# Patient Record
Sex: Female | Born: 1935 | Race: White | Hispanic: No
Health system: Southern US, Community
[De-identification: ages and names within clinical notes are randomized; demographics above are authoritative.]

## PROBLEM LIST (undated history)

## (undated) DIAGNOSIS — N289 Disorder of kidney and ureter, unspecified: Secondary | ICD-10-CM

## (undated) DIAGNOSIS — I10 Essential (primary) hypertension: Secondary | ICD-10-CM

## (undated) DIAGNOSIS — F0390 Unspecified dementia without behavioral disturbance: Secondary | ICD-10-CM

---

## 1999-10-23 ENCOUNTER — Other Ambulatory Visit: Admission: RE | Admit: 1999-10-23 | Discharge: 1999-10-23 | Payer: Self-pay | Admitting: *Deleted

## 2001-12-10 ENCOUNTER — Ambulatory Visit (HOSPITAL_COMMUNITY): Admission: RE | Admit: 2001-12-10 | Discharge: 2001-12-10 | Payer: Self-pay | Admitting: Obstetrics and Gynecology

## 2001-12-10 ENCOUNTER — Encounter: Payer: Self-pay | Admitting: Obstetrics and Gynecology

## 2002-01-12 ENCOUNTER — Encounter: Payer: Self-pay | Admitting: Gastroenterology

## 2002-01-12 ENCOUNTER — Inpatient Hospital Stay (HOSPITAL_COMMUNITY): Admission: EM | Admit: 2002-01-12 | Discharge: 2002-01-18 | Payer: Self-pay | Admitting: Gastroenterology

## 2003-04-12 ENCOUNTER — Ambulatory Visit (HOSPITAL_COMMUNITY): Admission: RE | Admit: 2003-04-12 | Discharge: 2003-04-12 | Payer: Self-pay | Admitting: Gastroenterology

## 2003-06-30 ENCOUNTER — Inpatient Hospital Stay (HOSPITAL_COMMUNITY)
Admission: EM | Admit: 2003-06-30 | Discharge: 2003-07-05 | Payer: Self-pay | Source: Home / Self Care | Admitting: Emergency Medicine

## 2005-03-20 IMAGING — CR DG PELVIS 1-2V
1 series · 1 of 1 positions shown · non-contrast
Comparison: none

CLINICAL DATA: Right hip pain, fall.
 PELVIS, ONE VIEW
 Minimally displaced fracture right superior pubic ramus.  No second fracture identified.  Osteoporosis.  Hip joint spaces symmetric.  Posterior portions of the pelvis difficult to assess due to technique and superimposed bowel and stool. 
 IMPRESSION
 Fracture right superior pubic ramus.
 RIGHT HIP, TWO VIEWS 
 No additional fracture or dislocation.  
 No additional abnormalities. 
 CHEST, ONE VIEW
 Cardiomegaly.  Tortuous aorta.  Vascularity normal.  Lungs clear.  
 Cardiomegaly.  No acute abnormalities.

[view not recorded]
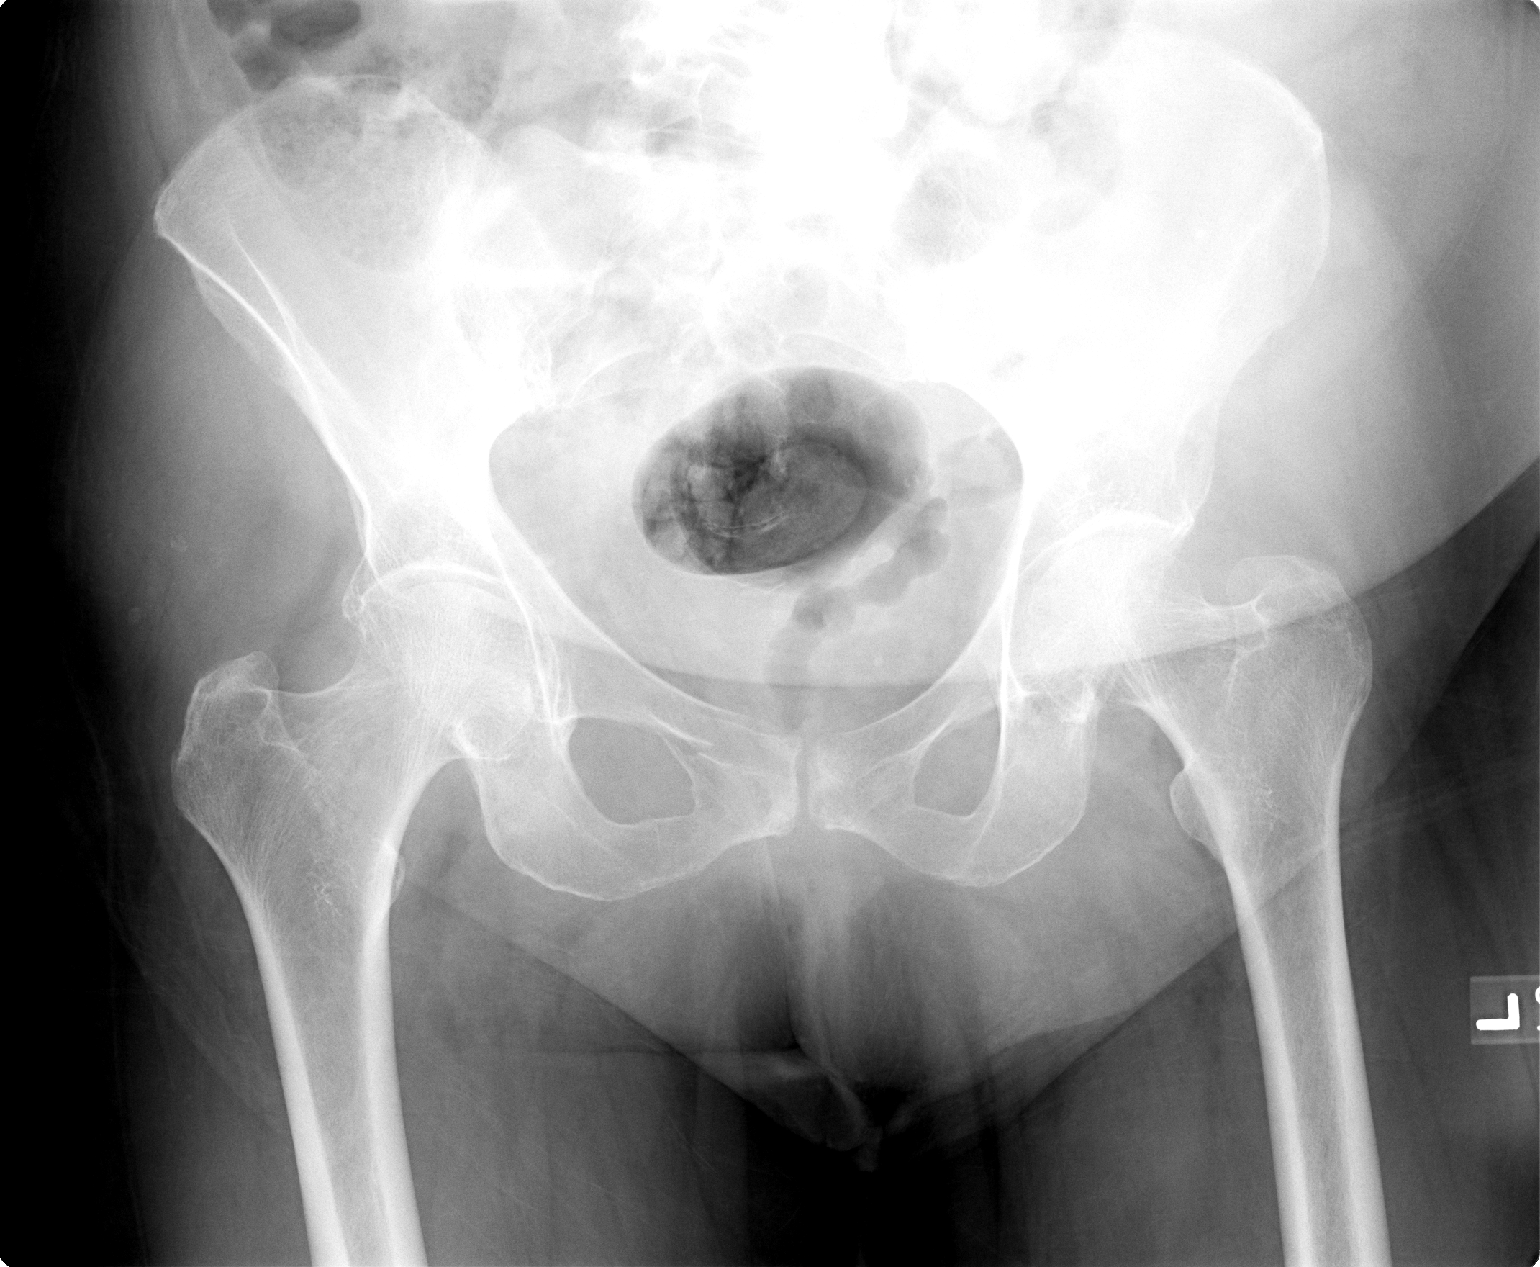

[1 of 1 positions shown; findings below may reference images not displayed]

## 2010-05-29 ENCOUNTER — Other Ambulatory Visit: Payer: Self-pay | Admitting: Dermatology

## 2018-02-14 ENCOUNTER — Emergency Department (HOSPITAL_BASED_OUTPATIENT_CLINIC_OR_DEPARTMENT_OTHER): Payer: Medicare HMO

## 2018-02-14 ENCOUNTER — Encounter (HOSPITAL_BASED_OUTPATIENT_CLINIC_OR_DEPARTMENT_OTHER): Payer: Self-pay | Admitting: *Deleted

## 2018-02-14 ENCOUNTER — Other Ambulatory Visit: Payer: Self-pay

## 2018-02-14 ENCOUNTER — Inpatient Hospital Stay (HOSPITAL_BASED_OUTPATIENT_CLINIC_OR_DEPARTMENT_OTHER)
Admission: EM | Admit: 2018-02-14 | Discharge: 2018-02-19 | DRG: 200 | Disposition: A | Discharge status: Skilled Nursing Facility | Payer: Medicare HMO | Source: Skilled Nursing Facility | Attending: General Surgery | Admitting: General Surgery

## 2018-02-14 DIAGNOSIS — S270XXA Traumatic pneumothorax, initial encounter: Principal | ICD-10-CM

## 2018-02-14 DIAGNOSIS — Z87891 Personal history of nicotine dependence: Secondary | ICD-10-CM

## 2018-02-14 DIAGNOSIS — N189 Chronic kidney disease, unspecified: Secondary | ICD-10-CM | POA: Diagnosis present

## 2018-02-14 DIAGNOSIS — W19XXXA Unspecified fall, initial encounter: Secondary | ICD-10-CM

## 2018-02-14 DIAGNOSIS — W050XXA Fall from non-moving wheelchair, initial encounter: Secondary | ICD-10-CM | POA: Diagnosis present

## 2018-02-14 DIAGNOSIS — Y92098 Other place in other non-institutional residence as the place of occurrence of the external cause: Secondary | ICD-10-CM

## 2018-02-14 DIAGNOSIS — J9382 Other air leak: Secondary | ICD-10-CM | POA: Diagnosis not present

## 2018-02-14 DIAGNOSIS — H9190 Unspecified hearing loss, unspecified ear: Secondary | ICD-10-CM | POA: Diagnosis present

## 2018-02-14 DIAGNOSIS — I129 Hypertensive chronic kidney disease with stage 1 through stage 4 chronic kidney disease, or unspecified chronic kidney disease: Secondary | ICD-10-CM | POA: Diagnosis present

## 2018-02-14 DIAGNOSIS — S2241XA Multiple fractures of ribs, right side, initial encounter for closed fracture: Secondary | ICD-10-CM

## 2018-02-14 DIAGNOSIS — Z91013 Allergy to seafood: Secondary | ICD-10-CM

## 2018-02-14 DIAGNOSIS — I714 Abdominal aortic aneurysm, without rupture: Secondary | ICD-10-CM | POA: Diagnosis present

## 2018-02-14 DIAGNOSIS — J939 Pneumothorax, unspecified: Secondary | ICD-10-CM

## 2018-02-14 DIAGNOSIS — Z79899 Other long term (current) drug therapy: Secondary | ICD-10-CM

## 2018-02-14 DIAGNOSIS — F0390 Unspecified dementia without behavioral disturbance: Secondary | ICD-10-CM | POA: Diagnosis present

## 2018-02-14 DIAGNOSIS — S27321A Contusion of lung, unilateral, initial encounter: Secondary | ICD-10-CM | POA: Diagnosis present

## 2018-02-14 DIAGNOSIS — Z66 Do not resuscitate: Secondary | ICD-10-CM | POA: Diagnosis present

## 2018-02-14 HISTORY — DX: Essential (primary) hypertension: I10

## 2018-02-14 HISTORY — DX: Unspecified dementia, unspecified severity, without behavioral disturbance, psychotic disturbance, mood disturbance, and anxiety: F03.90

## 2018-02-14 HISTORY — DX: Disorder of kidney and ureter, unspecified: N28.9

## 2018-02-14 LAB — CBC WITH DIFFERENTIAL/PLATELET
Abs Immature Granulocytes: 0.06 10*3/uL (ref 0.00–0.07)
Basophils Absolute: 0 10*3/uL (ref 0.0–0.1)
Basophils Relative: 0 %
EOS PCT: 1 %
Eosinophils Absolute: 0.1 10*3/uL (ref 0.0–0.5)
HEMATOCRIT: 34.6 % — AB (ref 36.0–46.0)
Hemoglobin: 11 g/dL — ABNORMAL LOW (ref 12.0–15.0)
Immature Granulocytes: 1 %
LYMPHS ABS: 1.6 10*3/uL (ref 0.7–4.0)
LYMPHS PCT: 16 %
MCH: 32 pg (ref 26.0–34.0)
MCHC: 31.8 g/dL (ref 30.0–36.0)
MCV: 100.6 fL — AB (ref 80.0–100.0)
MONO ABS: 0.7 10*3/uL (ref 0.1–1.0)
Monocytes Relative: 7 %
Neutro Abs: 7.7 10*3/uL (ref 1.7–7.7)
Neutrophils Relative %: 75 %
Platelets: 213 10*3/uL (ref 150–400)
RBC: 3.44 MIL/uL — ABNORMAL LOW (ref 3.87–5.11)
RDW: 12.9 % (ref 11.5–15.5)
WBC: 10 10*3/uL (ref 4.0–10.5)
nRBC: 0 % (ref 0.0–0.2)

## 2018-02-14 LAB — BASIC METABOLIC PANEL
Anion gap: 7 (ref 5–15)
BUN: 32 mg/dL — AB (ref 8–23)
CO2: 23 mmol/L (ref 22–32)
Calcium: 9.8 mg/dL (ref 8.9–10.3)
Chloride: 112 mmol/L — ABNORMAL HIGH (ref 98–111)
Creatinine, Ser: 1.34 mg/dL — ABNORMAL HIGH (ref 0.44–1.00)
GFR, EST AFRICAN AMERICAN: 38 mL/min — AB (ref >60)
GFR, EST NON AFRICAN AMERICAN: 33 mL/min — AB (ref >60)
GLUCOSE: 123 mg/dL — AB (ref 70–99)
Potassium: 4.4 mmol/L (ref 3.5–5.1)
Sodium: 142 mmol/L (ref 135–145)

## 2018-02-14 LAB — HEPATIC FUNCTION PANEL
ALK PHOS: 77 U/L (ref 38–126)
ALT: 12 U/L (ref 0–44)
AST: 21 U/L (ref 15–41)
Albumin: 3.7 g/dL (ref 3.5–5.0)
BILIRUBIN DIRECT: 0.1 mg/dL (ref 0.0–0.2)
BILIRUBIN INDIRECT: 0.3 mg/dL (ref 0.3–0.9)
TOTAL PROTEIN: 6.8 g/dL (ref 6.5–8.1)
Total Bilirubin: 0.4 mg/dL (ref 0.3–1.2)

## 2018-02-14 MED ORDER — SODIUM CHLORIDE 0.9 % IV BOLUS
500.0000 mL | Freq: Once | INTRAVENOUS | Status: AC
  Administered 2018-02-14: 500 mL via INTRAVENOUS

## 2018-02-14 MED ORDER — FENTANYL CITRATE (PF) 100 MCG/2ML IJ SOLN
50.0000 ug | Freq: Once | INTRAMUSCULAR | Status: AC
  Administered 2018-02-14: 50 ug via INTRAVENOUS
  Filled 2018-02-14: qty 2

## 2018-02-14 MED ORDER — LIDOCAINE HCL 2 % IJ SOLN
INTRAMUSCULAR | Status: AC
  Administered 2018-02-14: 400 mg
  Filled 2018-02-14: qty 20

## 2018-02-14 MED ORDER — MORPHINE SULFATE (PF) 4 MG/ML IV SOLN
4.0000 mg | Freq: Once | INTRAVENOUS | Status: AC
  Administered 2018-02-14: 4 mg via INTRAVENOUS
  Filled 2018-02-14: qty 1

## 2018-02-14 MED ORDER — IOPAMIDOL (ISOVUE-300) INJECTION 61%
100.0000 mL | Freq: Once | INTRAVENOUS | Status: AC
  Administered 2018-02-14: 100 mL via INTRAVENOUS

## 2018-02-14 NOTE — ED Notes (Signed)
Chest tube tray gathered by Beryle Flock.

## 2018-02-14 NOTE — ED Provider Notes (Signed)
  Physical Exam  BP (!) 184/82 (BP Location: Left Arm)   Pulse 67   Temp 97.9 F (36.6 C) (Oral)   Resp 18   SpO2 96%   Physical Exam  ED Course/Procedures   Clinical Course as of Feb 14 2341  Fri Feb 14, 2018  1921 Pneumothorax on Portable chest; patient seen by Dr. Alvino Chapel; agrees with workup imaging at this time; NRB placed on patient. Pain controlled. CT informed.   [BM]  1944 Discussed with lab, they are running metabolic panel now.   [BM]  2010 CT called by nursing staff to expedite process   [BM]    Clinical Course User Index [BM] Deliah Boston, PA-C    CHEST TUBE INSERTION Date/Time: 02/14/2018 11:43 PM Performed by: Davonna Belling, MD Authorized by: Davonna Belling, MD   Consent:    Consent obtained:  Written   Consent given by:  Patient   Risks discussed:  Bleeding, damage to surrounding structures, infection and incomplete drainage   Alternatives discussed:  No treatment and delayed treatment Pre-procedure details:    Skin preparation:  ChloraPrep   Preparation: Patient was prepped and draped in the usual sterile fashion   Anesthesia (see MAR for exact dosages):    Anesthesia method:  Local infiltration   Local anesthetic:  Lidocaine 2% w/o epi Procedure details:    Placement location:  R lateral   Tube size (Fr):  Minicatheter (pigtail catheter)   Ultrasound guidance: no     Tension pneumothorax: no     Tube connected to:  Suction   Drainage characteristics:  Air only   Suture material:  0 silk   Dressing: Antibacterial disc and Tegaderm. Post-procedure details:    Post-insertion x-ray findings: tube in good position     Patient tolerance of procedure:  Tolerated well, no immediate complications    MDM  I had discussed with Dr. Donne Hazel for transfer to [facility].  He has multiple rib fractures.  Decision was made to put in chest tube.  Pigtail catheter placed.  Improvement in pneumothorax on x-ray.  Discussed also with Dr. Tyrone Nine at  [facility].  Will transfer.        Davonna Belling, MD 02/14/18 [phone]

## 2018-02-14 NOTE — ED Triage Notes (Signed)
Pt to room 2 by ems, reports the wheels on her walker got turned the wrong way and caused her to fall. Pt cc is right rib area pain, denies any other c/o or injury.

## 2018-02-14 NOTE — ED Provider Notes (Addendum)
[facility] EMERGENCY DEPARTMENT Provider Note   CSN: [id number] Arrival date & time: 02/14/18  1817     History   Chief Complaint Chief Complaint  Patient presents with  . Fall    HPI Crystal Benson is a 84 y.o. female seen today after fall at assisted living facility that occurred just prior to arrival.  Patient reports that the wheels on her walker were stuck causing her to fall striking the right side of her ribs on the ground.  Patient denies head injury, neck pain or loss of consciousness.  Patient denies blood thinner use.  Patient states that she had immediate right-sided rib pain that has been constant, moderate intensity and throbbing since time of fall.  Has not improved.  Patient states that pain is aggravated with deep breaths.  Patient was brought in by EMS today who noted SPO2 of 92% they started patient on nasal cannula.   Patient with DNR at bedside.  HPI  Past Medical History:  Diagnosis Date  . Dementia ([place])   . Hypertension   . Renal disorder     There are no active problems to display for this patient.   History reviewed. No pertinent surgical history.   OB History   None      Home Medications    Prior to Admission medications   Medication Sig Start Date End Date Taking? Authorizing Provider  carboxymethylcellulose (REFRESH PLUS) 0.5 % SOLN 1 drop 3 (three) times daily as needed.   Yes [provider]  Cholecalciferol 50 MCG (2000 UT) TABS Take by mouth.   Yes [provider]  losartan (COZAAR) 100 MG tablet Take 100 mg by mouth daily.   Yes [provider]  omeprazole (PRILOSEC) 20 MG capsule Take 20 mg by mouth daily.   Yes [provider]  traMADol (ULTRAM) 50 MG tablet Take by mouth every 6 (six) hours as needed.   Yes [provider]    Family History History reviewed. No pertinent family history.  Social History Social History   Tobacco Use  . Smoking status: Former Research scientist (life sciences)  .  Smokeless tobacco: Never Used  Substance Use Topics  . Alcohol use: Not on file  . Drug use: Not on file     Allergies   Shellfish allergy   Review of Systems Review of Systems  Constitutional: Negative.  Negative for chills and fever.  HENT: Negative.  Negative for rhinorrhea and sore throat.   Eyes: Negative.  Negative for visual disturbance.  Respiratory: Negative.  Negative for cough and shortness of breath.   Cardiovascular: Positive for chest pain (Right-sided rib pain).  Gastrointestinal: Negative.  Negative for abdominal pain, nausea and vomiting.  Genitourinary: Negative.  Negative for dysuria and hematuria.  Musculoskeletal: Negative.  Negative for arthralgias, myalgias, neck pain and neck stiffness.  Skin: Negative.  Negative for rash.  Neurological: Negative.  Negative for dizziness, weakness and headaches.   Physical Exam Updated Vital Signs BP (!) 165/71 (BP Location: Right Arm)   Pulse (!) 59   Temp 97.9 F (36.6 C) (Oral)   Resp 16   SpO2 92%   Physical Exam  Constitutional: She is oriented to person, place, and time. She appears well-developed and well-nourished. No distress.  HENT:  Head: Normocephalic and atraumatic. Head is without raccoon's eyes, without Battle's sign, without abrasion and without contusion.  Right Ear: Hearing, tympanic membrane, external ear and ear canal normal. No hemotympanum.  Left Ear: Hearing, tympanic membrane, external ear  and ear canal normal. No hemotympanum.  Nose: Nose normal.  Mouth/Throat: Uvula is midline, oropharynx is clear and moist and mucous membranes are normal.  Eyes: Pupils are equal, round, and reactive to light. Conjunctivae and EOM are normal.  Neck: Trachea normal, normal range of motion, full passive range of motion without pain and phonation normal. Neck supple. No tracheal tenderness, no spinous process tenderness and no muscular tenderness present. No tracheal deviation present.  Cardiovascular: Normal  rate, regular rhythm and intact distal pulses.  Pulses:      Radial pulses are 2+ on the right side, and 2+ on the left side.       Dorsalis pedis pulses are 2+ on the right side, and 2+ on the left side.       Posterior tibial pulses are 2+ on the right side, and 2+ on the left side.  Pulmonary/Chest: Effort normal and breath sounds normal. No accessory muscle usage. No respiratory distress. She has no wheezes. She exhibits tenderness. She exhibits no deformity.  No signs of injury to the chest.    Abdominal: Soft. There is no tenderness. There is no rigidity, no rebound and no guarding.  No signs of injury to the abdomen  Musculoskeletal: Normal range of motion.  No midline spinal tenderness to palpation.  No crepitus step-off or deformity of the spine.  No paraspinal muscular tenderness to palpation.  No signs of injury to the back.  Tenderness to palpation of the hips.  Patient able to perform knee-to-chest without difficulty or pain.  Feet:  Right Foot:  Protective Sensation: 3 sites tested. 3 sites sensed.  Left Foot:  Protective Sensation: 3 sites tested. 3 sites sensed.  Neurological: She is alert and oriented to person, place, and time. GCS eye subscore is 4. GCS verbal subscore is 5. GCS motor subscore is 6.  Mental Status: Alert, oriented, thought content appropriate, able to give a coherent history. Speech fluent without evidence of aphasia. Able to follow 2 step commands without difficulty. Cranial Nerves: II: Peripheral visual fields grossly normal, pupils equal, round, reactive to light III,IV, VI: ptosis not present, extra-ocular motions intact bilaterally V,VII: smile symmetric, eyebrows raise symmetric, facial light touch sensation equal VIII: hearing grossly normal to voice X: uvula elevates symmetrically XI: bilateral shoulder shrug symmetric and strong XII: midline tongue extension without fassiculations Motor: Normal tone. 5/5 strength in upper and lower  extremities bilaterally including strong and equal grip strength and dorsiflexion/plantar flexion Sensory: Sensation intact to light touch in all extremities. Cerebellar: normal finger-to-nose with bilateral upper extremities. CV: distal pulses palpable throughout  Skin: Skin is warm, dry and intact. Capillary refill takes less than 2 seconds. No bruising and no ecchymosis noted.  Psychiatric: She has a normal mood and affect. Her behavior is normal.     ED Treatments / Results  Labs (all labs ordered are listed, but only abnormal results are displayed) Labs Reviewed  CBC WITH DIFFERENTIAL/PLATELET - Abnormal; Notable for the following components:      Result Value   RBC 3.44 (*)    Hemoglobin 11.0 (*)    HCT 34.6 (*)    MCV 100.6 (*)    All other components within normal limits  BASIC METABOLIC PANEL - Abnormal; Notable for the following components:   Chloride 112 (*)    Glucose, Bld 123 (*)    BUN 32 (*)    Creatinine, Ser 1.34 (*)    GFR calc non Af Amer 33 (*)  GFR calc Af Amer 38 (*)    All other components within normal limits  HEPATIC FUNCTION PANEL    EKG None  Radiology Dg Chest Portable 1 View  Result Date: 02/14/2018 CLINICAL DATA:  Fall today, RIGHT-sided rib pain. EXAM: PORTABLE CHEST 1 VIEW COMPARISON:  Chest x-ray dated 06/30/2003 FINDINGS: Heart size is upper normal. Mediastinal contours are within normal limits, given the evidence of age-related aortic ectasia. Moderate-sized RIGHT pneumothorax. LEFT lung is clear. Multiple suspected RIGHT-sided rib fractures. IMPRESSION: 1. RIGHT-sided pneumothorax, moderate in size. No significant leftward shift of the mediastinal structures. 2. Suspect multiple RIGHT-sided rib fractures. These results were called by telephone at the time of interpretation on 02/14/2018 at 7:26 pm to the emergency room physician, who verbally acknowledged these results. Electronically Signed   By: Franki Cabot M.D.   On: 02/14/2018 19:27     Procedures Procedures (including critical care time)  Medications Ordered in ED Medications  iopamidol (ISOVUE-300) 61 % injection 100 mL (has no administration in time range)  sodium chloride 0.9 % bolus 500 mL (500 mLs Intravenous New Bag/Given 02/14/18 1910)  morphine 4 MG/ML injection 4 mg (4 mg Intravenous Given 02/14/18 1918)     Initial Impression / Assessment and Plan / ED Course  I have reviewed the triage vital signs and the nursing notes.  Pertinent labs & imaging results that were available during my care of the patient were reviewed by me and considered in my medical decision making (see chart for details).  Clinical Course as of Feb 15 2019  Fri Feb 14, 2018  1921 Pneumothorax on Portable chest; patient seen by Dr. Alvino Chapel; agrees with workup imaging at this time; NRB placed on patient. Pain controlled. CT informed.   [BM]  1944 Discussed with lab, they are running metabolic panel now.   [BM]  2010 CT called by nursing staff to expedite process   [BM]    Clinical Course User Index [BM] Gari Crown   84 year old female presenting after fall from assisted living facility today.  Only complaint of right rib pain worse with breathing.  Portable chest today shows pneumothorax of the right side.  Vital signs stable patient placed on nonrebreather.  Patient evaluated by Dr. Alvino Chapel, does not require chest tube at this time.  CT scans ordered, basic blood work drawn.  BMP shows elevated creatinine CBC nonacute Hepatic function within normal limits Fluids given Pain controlled Vital signs have remained stable  Patient transported to CT.  Patient care has been taken over by Dr. Alvino Chapel at shift change.  Note: Portions of this report may have been transcribed using voice recognition software. Every effort was made to ensure accuracy; however, inadvertent computerized transcription errors may still be present. Final Clinical Impressions(s) / ED  Diagnoses   Final diagnoses:  None    ED Discharge Orders    None       Gari Crown 02/14/18 2032    Gari Crown 02/14/18 2034    Davonna Belling, MD 02/15/18 [phone]

## 2018-02-15 DIAGNOSIS — Z91013 Allergy to seafood: Secondary | ICD-10-CM | POA: Diagnosis not present

## 2018-02-15 DIAGNOSIS — S27321A Contusion of lung, unilateral, initial encounter: Secondary | ICD-10-CM | POA: Diagnosis present

## 2018-02-15 DIAGNOSIS — N189 Chronic kidney disease, unspecified: Secondary | ICD-10-CM | POA: Diagnosis present

## 2018-02-15 DIAGNOSIS — Z79899 Other long term (current) drug therapy: Secondary | ICD-10-CM | POA: Diagnosis not present

## 2018-02-15 DIAGNOSIS — Y92098 Other place in other non-institutional residence as the place of occurrence of the external cause: Secondary | ICD-10-CM | POA: Diagnosis not present

## 2018-02-15 DIAGNOSIS — J9382 Other air leak: Secondary | ICD-10-CM | POA: Diagnosis not present

## 2018-02-15 DIAGNOSIS — H9190 Unspecified hearing loss, unspecified ear: Secondary | ICD-10-CM | POA: Diagnosis present

## 2018-02-15 DIAGNOSIS — S270XXA Traumatic pneumothorax, initial encounter: Secondary | ICD-10-CM | POA: Diagnosis present

## 2018-02-15 DIAGNOSIS — I714 Abdominal aortic aneurysm, without rupture: Secondary | ICD-10-CM | POA: Diagnosis present

## 2018-02-15 DIAGNOSIS — Z66 Do not resuscitate: Secondary | ICD-10-CM | POA: Diagnosis present

## 2018-02-15 DIAGNOSIS — F0390 Unspecified dementia without behavioral disturbance: Secondary | ICD-10-CM | POA: Diagnosis present

## 2018-02-15 DIAGNOSIS — W050XXA Fall from non-moving wheelchair, initial encounter: Secondary | ICD-10-CM | POA: Diagnosis present

## 2018-02-15 DIAGNOSIS — Z87891 Personal history of nicotine dependence: Secondary | ICD-10-CM | POA: Diagnosis not present

## 2018-02-15 DIAGNOSIS — J939 Pneumothorax, unspecified: Secondary | ICD-10-CM | POA: Diagnosis present

## 2018-02-15 DIAGNOSIS — S2241XA Multiple fractures of ribs, right side, initial encounter for closed fracture: Secondary | ICD-10-CM | POA: Diagnosis present

## 2018-02-15 DIAGNOSIS — I129 Hypertensive chronic kidney disease with stage 1 through stage 4 chronic kidney disease, or unspecified chronic kidney disease: Secondary | ICD-10-CM | POA: Diagnosis present

## 2018-02-15 LAB — BASIC METABOLIC PANEL
ANION GAP: 6 (ref 5–15)
BUN: 27 mg/dL — ABNORMAL HIGH (ref 8–23)
CALCIUM: 9.7 mg/dL (ref 8.9–10.3)
CO2: 23 mmol/L (ref 22–32)
Chloride: 106 mmol/L (ref 98–111)
Creatinine, Ser: 1.32 mg/dL — ABNORMAL HIGH (ref 0.44–1.00)
GFR, EST AFRICAN AMERICAN: 38 mL/min — AB (ref >60)
GFR, EST NON AFRICAN AMERICAN: 33 mL/min — AB (ref >60)
Glucose, Bld: 166 mg/dL — ABNORMAL HIGH (ref 70–99)
Potassium: 4.6 mmol/L (ref 3.5–5.1)
Sodium: 135 mmol/L (ref 135–145)

## 2018-02-15 LAB — CBC
HEMATOCRIT: 33.6 % — AB (ref 36.0–46.0)
Hemoglobin: 10.8 g/dL — ABNORMAL LOW (ref 12.0–15.0)
MCH: 31.4 pg (ref 26.0–34.0)
MCHC: 32.1 g/dL (ref 30.0–36.0)
MCV: 97.7 fL (ref 80.0–100.0)
NRBC: 0 % (ref 0.0–0.2)
Platelets: 205 10*3/uL (ref 150–400)
RBC: 3.44 MIL/uL — AB (ref 3.87–5.11)
RDW: 12.9 % (ref 11.5–15.5)
WBC: 9.3 10*3/uL (ref 4.0–10.5)

## 2018-02-15 MED ORDER — ONDANSETRON HCL 4 MG/2ML IJ SOLN: 4.0000 mg | Freq: Four times a day (QID) | INTRAMUSCULAR | Status: DC | PRN

## 2018-02-15 MED ORDER — PANTOPRAZOLE SODIUM 40 MG PO TBEC
40.0000 mg | DELAYED_RELEASE_TABLET | Freq: Every day | ORAL | Status: DC
  Administered 2018-02-15 – 2018-02-19 (×5): 40 mg via ORAL
  Filled 2018-02-15 (×5): qty 1

## 2018-02-15 MED ORDER — ACETAMINOPHEN 500 MG PO TABS
1000.0000 mg | ORAL_TABLET | Freq: Four times a day (QID) | ORAL | Status: AC
  Administered 2018-02-15 – 2018-02-16 (×2): 1000 mg via ORAL
  Filled 2018-02-15 (×3): qty 2

## 2018-02-15 MED ORDER — MORPHINE SULFATE (PF) 2 MG/ML IV SOLN
1.0000 mg | INTRAVENOUS | Status: DC | PRN
  Administered 2018-02-15 – 2018-02-17 (×3): 1 mg via INTRAVENOUS
  Filled 2018-02-15 (×3): qty 1

## 2018-02-15 MED ORDER — TRAMADOL HCL 50 MG PO TABS
50.0000 mg | ORAL_TABLET | Freq: Four times a day (QID) | ORAL | Status: DC | PRN
  Administered 2018-02-15 – 2018-02-19 (×3): 50 mg via ORAL
  Filled 2018-02-15 (×3): qty 1

## 2018-02-15 MED ORDER — ONDANSETRON 4 MG PO TBDP
4.0000 mg | ORAL_TABLET | Freq: Four times a day (QID) | ORAL | Status: DC | PRN
  Filled 2018-02-15: qty 1

## 2018-02-15 MED ORDER — LOSARTAN POTASSIUM 50 MG PO TABS
100.0000 mg | ORAL_TABLET | Freq: Every day | ORAL | Status: DC
  Administered 2018-02-15 – 2018-02-19 (×5): 100 mg via ORAL
  Filled 2018-02-15 (×5): qty 2

## 2018-02-15 MED ORDER — SODIUM CHLORIDE 0.9 % IV SOLN
INTRAVENOUS | Status: DC
  Administered 2018-02-15: 03:00:00 via INTRAVENOUS

## 2018-02-15 MED ORDER — DOCUSATE SODIUM 100 MG PO CAPS
100.0000 mg | ORAL_CAPSULE | Freq: Two times a day (BID) | ORAL | Status: DC
  Administered 2018-02-15 – 2018-02-19 (×9): 100 mg via ORAL
  Filled 2018-02-15 (×8): qty 1

## 2018-02-15 MED ORDER — METHOCARBAMOL 500 MG PO TABS: 500.0000 mg | ORAL_TABLET | Freq: Three times a day (TID) | ORAL | Status: DC | PRN

## 2018-02-15 MED ORDER — BISACODYL 10 MG RE SUPP: 10.0000 mg | Freq: Every day | RECTAL | Status: DC | PRN

## 2018-02-15 MED ORDER — OXYCODONE HCL 5 MG PO TABS
5.0000 mg | ORAL_TABLET | ORAL | Status: DC | PRN
  Administered 2018-02-18: 5 mg via ORAL
  Filled 2018-02-15: qty 1

## 2018-02-15 MED ORDER — ENOXAPARIN SODIUM 40 MG/0.4ML ~~LOC~~ SOLN
40.0000 mg | Freq: Every day | SUBCUTANEOUS | Status: DC
  Administered 2018-02-16 – 2018-02-17 (×2): 40 mg via SUBCUTANEOUS
  Filled 2018-02-15 (×2): qty 0.4

## 2018-02-15 NOTE — Evaluation (Signed)
Occupational Therapy Evaluation Patient Details Name: Crystal Benson MRN: [id number] DOB: 1920-09-27 Today's Date: 02/15/2018    History of Present Illness Pt is a 84 y.o. female who fell at her ILF [facility]) sustaining R rib fxs and R pneumothorax.    Clinical Impression   PTA, pt was living in independent living facility and had PCA 3 hours/day on weekdays to assist with showering and other ADL. She is mod independent for all other ADL although it takes significantly longer without her aide. She is limited by pain today requiring max-total assist for ADL participation at EOB. She was able to sit at EOB with cues for breathing techniques and maintaining SpO2 >90% for 5 minutes. She would benefit from continued OT services while admitted to improve independence and safety with ADL. Recommend SNF level rehabilitation post-acute D/C.     Follow Up Recommendations  SNF;Supervision/Assistance - 24 hour    Equipment Recommendations  Other (comment)(TBD at next venue of care)    Recommendations for Other Services       Precautions / Restrictions Precautions Precautions: Fall;Other (comment) Precaution Comments: R chest tube      Mobility Bed Mobility Overal bed mobility: Needs Assistance Bed Mobility: Supine to Sit;Sit to Supine     Supine to sit: +2 for physical assistance;Max assist Sit to supine: +2 for physical assistance;Max assist   General bed mobility comments: helicopter method utilized for sup<>sit to assist with minimizing pain with mobility. Bed pad used to pivot and scoot to EOB.  Transfers                 General transfer comment: unable due to pain    Balance Overall balance assessment: Needs assistance Sitting-balance support: Bilateral upper extremity supported;Feet supported Sitting balance-Leahy Scale: Poor Sitting balance - Comments:  reliant on BUE and external support due to pain. Pt requiring min posterior support for sitting EOB x 5  minutes.                                   ADL either performed or assessed with clinical judgement   ADL Overall ADL's : Needs assistance/impaired Eating/Feeding: Maximal assistance;Sitting   Grooming: Set up;Bed level   Upper Body Bathing: Total assistance;Sitting   Lower Body Bathing: Total assistance;Bed level   Upper Body Dressing : Total assistance;Sitting   Lower Body Dressing: Total assistance;Bed level                 General ADL Comments: Pt able to tolerate sitting at EOB today for 5 minutes. Unable to move hands from bed for grooming at EOB.      Vision Baseline Vision/History: Wears glasses Patient Visual Report: No change from baseline Vision Assessment?: No apparent visual deficits     Perception     Praxis      Pertinent Vitals/Pain Pain Assessment: Faces Faces Pain Scale: Hurts whole lot Pain Location: R ribs and trunk with mobility Pain Descriptors / Indicators: Guarding;Grimacing;Moaning Pain Intervention(s): Limited activity within patient's tolerance;Monitored during session;Repositioned     Hand Dominance     Extremity/Trunk Assessment Upper Extremity Assessment Upper Extremity Assessment: Generalized weakness   Lower Extremity Assessment Lower Extremity Assessment: Generalized weakness   Cervical / Trunk Assessment Cervical / Trunk Assessment: Kyphotic   Communication Communication Communication: HOH   Cognition Arousal/Alertness: Awake/alert Behavior During Therapy: WFL for tasks assessed/performed Overall Cognitive Status: Within Functional Limits for tasks assessed  General Comments  Daughter-in-law and 2 granddaughters present    Exercises     Shoulder Instructions      Home Living Family/patient expects to be discharged to:: Skilled nursing facility                                 Additional Comments: Plan for SNF at [facility]       Prior Functioning/Environment Level of Independence: Needs assistance  Gait / Transfers Assistance Needed: amb with rollator to dining room for meals ADL's / Homemaking Assistance Needed: PCA 3 hrs/day M-F, assisting with cleaning and ADLs. Pt able to complete ADLs (except showering) mod I Sat/Sun with significantly increased time.            OT Problem List: Decreased strength;Decreased range of motion;Decreased activity tolerance;Impaired balance (sitting and/or standing);Decreased safety awareness;Decreased knowledge of use of DME or AE;Decreased knowledge of precautions;Pain      OT Treatment/Interventions: Self-care/ADL training;Therapeutic exercise;Energy conservation;DME and/or AE instruction;Therapeutic activities;Patient/family education;Balance training    OT Goals(Current goals can be found in the care plan section) Acute Rehab OT Goals Patient Stated Goal: decrease pain OT Goal Formulation: With patient/family Time For Goal Achievement: 03/01/18 Potential to Achieve Goals: Good ADL Goals Pt Will Perform Eating: with supervision;sitting Pt Will Perform Grooming: sitting;with supervision Pt Will Transfer to Toilet: with mod assist;ambulating;bedside commode;regular height toilet Pt Will Perform Toileting - Clothing Manipulation and hygiene: with mod assist;sit to/from stand  OT Frequency: Min 2X/week   Barriers to D/C:            Co-evaluation PT/OT/SLP Co-Evaluation/Treatment: Yes Reason for Co-Treatment: Complexity of the patient's impairments (multi-system involvement);For patient/therapist safety;To address functional/ADL transfers   OT goals addressed during session: ADL's and self-care      AM-PAC OT "6 Clicks" Daily Activity     Outcome Measure Help from another person eating meals?: A Lot Help from another person taking care of personal grooming?: A Lot Help from another person toileting, which includes using toliet, bedpan, or urinal?: Total Help  from another person bathing (including washing, rinsing, drying)?: A Lot Help from another person to put on and taking off regular upper body clothing?: A Lot Help from another person to put on and taking off regular lower body clothing?: Total 6 Click Score: 10   End of Session Equipment Utilized During Treatment: Oxygen(2L O2) Nurse Communication: Mobility status  Activity Tolerance: Patient tolerated treatment well Patient left: in bed;with call bell/phone within reach;with family/visitor present  OT Visit Diagnosis: Other abnormalities of gait and mobility (R26.89);Pain Pain - part of body: (ribs)                Time: 1351-1415 OT Time Calculation (min): 24 min Charges:  OT General Charges $OT Visit: 1 Visit OT Evaluation $OT Eval Moderate Complexity: [employer] [place] A Albertina Leise 02/15/2018, 3:28 PM

## 2018-02-15 NOTE — H&P (Signed)
Crystal Benson is an 84 y.o. female.   Chief Complaint: fall right chest pain HPI: 89 yof lives in assisted living remembers a fall when she had some difficulty with her walker today. Fell on right side. Is hard of hearing.  Now with some pain right side. Seen at outside er and found to have right rib fx with ptx. Pigtail placed there and transferred here. She does not remember all medical issues  Past Medical History:  Diagnosis Date  . Dementia ([place])   . Hypertension   . Renal disorder     PSH unknown  History reviewed. No pertinent family history. Social History:  reports that she has quit smoking. She has never used smokeless tobacco. Her alcohol and drug histories are not on file.  Allergies:  Allergies  Allergen Reactions  . Shellfish Allergy    meds reviewed from chart, she does not know them  Results for orders placed or performed during the hospital encounter of 02/14/18 (from the past 48 hour(s))  CBC with Differential     Status: Abnormal   Collection Time: 02/14/18  7:09 PM  Result Value Ref Range   WBC 10.0 4.0 - 10.5 K/uL   RBC 3.44 (L) 3.87 - 5.11 MIL/uL   Hemoglobin 11.0 (L) 12.0 - 15.0 g/dL   HCT 34.6 (L) 36.0 - 46.0 %   MCV 100.6 (H) 80.0 - 100.0 fL   MCH 32.0 26.0 - 34.0 pg   MCHC 31.8 30.0 - 36.0 g/dL   RDW 12.9 11.5 - 15.5 %   Platelets 213 150 - 400 K/uL   nRBC 0.0 0.0 - 0.2 %   Neutrophils Relative % 75 %   Neutro Abs 7.7 1.7 - 7.7 K/uL   Lymphocytes Relative 16 %   Lymphs Abs 1.6 0.7 - 4.0 K/uL   Monocytes Relative 7 %   Monocytes Absolute 0.7 0.1 - 1.0 K/uL   Eosinophils Relative 1 %   Eosinophils Absolute 0.1 0.0 - 0.5 K/uL   Basophils Relative 0 %   Basophils Absolute 0.0 0.0 - 0.1 K/uL   Immature Granulocytes 1 %   Abs Immature Granulocytes 0.06 0.00 - 0.07 K/uL    Comment: Performed at [facility], [address] [place], [place] [address]  Basic metabolic panel     Status: Abnormal   Collection Time: 02/14/18  7:09 PM    Result Value Ref Range   Sodium 142 135 - 145 mmol/L   Potassium 4.4 3.5 - 5.1 mmol/L   Chloride 112 (H) 98 - 111 mmol/L   CO2 23 22 - 32 mmol/L   Glucose, Bld 123 (H) 70 - 99 mg/dL   BUN 32 (H) 8 - 23 mg/dL   Creatinine, Ser 1.34 (H) 0.44 - 1.00 mg/dL   Calcium 9.8 8.9 - 10.3 mg/dL   GFR calc non Af Amer 33 (L) >60 mL/min   GFR calc Af Amer 38 (L) >60 mL/min    Comment: (NOTE) The eGFR has been calculated using the CKD EPI equation. This calculation has not been validated in all clinical situations. eGFR's persistently <60 mL/min signify possible Chronic Kidney Disease.    Anion gap 7 5 - 15    Comment: Performed at [facility], [address] [place], [place] [address]  Hepatic function panel     Status: None   Collection Time: 02/14/18  7:09 PM  Result Value Ref Range   Total Protein 6.8 6.5 - 8.1 g/dL  Albumin 3.7 3.5 - 5.0 g/dL   AST 21 15 - 41 U/L   ALT 12 0 - 44 U/L   Alkaline Phosphatase 77 38 - 126 U/L   Total Bilirubin 0.4 0.3 - 1.2 mg/dL   Bilirubin, Direct 0.1 0.0 - 0.2 mg/dL   Indirect Bilirubin 0.3 0.3 - 0.9 mg/dL    Comment: Performed at [facility], [address] [place], [place] [address]   Ct Head Wo Contrast  Result Date: 02/14/2018 CLINICAL DATA:  Fall, dementia. EXAM: CT HEAD WITHOUT CONTRAST CT CERVICAL SPINE WITHOUT CONTRAST TECHNIQUE: Multidetector CT imaging of the head and cervical spine was performed following the standard protocol without intravenous contrast. Multiplanar CT image reconstructions of the cervical spine were also generated. COMPARISON:  None. FINDINGS: CT HEAD FINDINGS Brain: Mild generalized age related parenchymal volume loss with commensurate dilatation of the ventricles and sulci. Mild chronic small vessel ischemic changes within the deep periventricular white matter regions. No mass, hemorrhage, edema or other evidence of acute parenchymal abnormality. No extra-axial hemorrhage. Vascular: No acute  findings. Skull: Normal. Negative for fracture or focal lesion. Sinuses/Orbits: No acute finding. Other: None. CT CERVICAL SPINE FINDINGS Alignment: No evidence of acute vertebral body subluxation. Skull base and vertebrae: No fracture line or displaced fracture fragment appreciated. Facet joints appear intact and normally aligned. Soft tissues and spinal canal: No prevertebral fluid or swelling. No visible canal hematoma. Disc levels: Disc desiccations throughout the cervical spine, with associated osseous spurring and disc-osteophytic bulges at multiple levels, resulting in mild to moderate central canal stenoses at the C3-4 through C6-7 levels. Upper chest: Pneumothorax at the RIGHT lung apex, as seen on earlier chest x-ray and chest CT. Other: Bilateral carotid atherosclerosis. IMPRESSION: 1. No acute intracranial abnormality. No intracranial mass, hemorrhage or edema. No skull fracture. Atrophy and chronic ischemic changes in the white matter. 2. No fracture or acute subluxation within the cervical spine. Degenerative changes within the cervical spine, as detailed above. 3. Pneumothorax at the RIGHT lung apex, as seen on earlier chest x-ray and chest CT. Electronically Signed   By: Franki Cabot M.D.   On: 02/14/2018 21:11   Ct Chest W Contrast  Result Date: 02/14/2018 CLINICAL DATA:  Fall, RIGHT-sided rib pain. History of hypertension, dementia. EXAM: CT CHEST, ABDOMEN, AND PELVIS WITH CONTRAST TECHNIQUE: Multidetector CT imaging of the chest, abdomen and pelvis was performed following the standard protocol during bolus administration of intravenous contrast. CONTRAST:  164m ISOVUE-300 IOPAMIDOL (ISOVUE-300) INJECTION 61% COMPARISON:  Chest x-ray from earlier same day. FINDINGS: CT CHEST FINDINGS Cardiovascular: No thoracic aortic aneurysm or evidence of aortic dissection. Scattered aortic atherosclerosis. Borderline cardiomegaly. No pericardial effusion. Mediastinum/Nodes: No hemorrhage or edema  appreciated within the mediastinum. No mass or enlarged lymph nodes seen. Esophagus is unremarkable. Trachea and central bronchi are unremarkable. Lungs/Pleura: Pneumothorax, small to moderate in size, now most prominently seen at the RIGHT lung base with patient in supine positioning. Lung contusion and/or atelectasis at the RIGHT lung base. No pleural effusion or hemothorax seen. Mild scarring/atelectasis at the LEFT lung base. Musculoskeletal: Slightly displaced fractures of the RIGHT lateral fifth and sixth ribs. Additional slightly displaced fractures of the posterolateral RIGHT ninth and tenth ribs. Additional slight deformities of the posterior RIGHT ninth and tenth ribs, of indeterminate age. Mild compression fracture deformity of the T7 vertebral body, of uncertain age. No associated vertebral body retropulsion. CT ABDOMEN PELVIS FINDINGS Hepatobiliary: No hepatic injury or perihepatic hematoma. Gallbladder is unremarkable Pancreas: Calcifications  throughout the pancreas, indicating chronic pancreatitis. No acute findings. Spleen: No splenic injury or perisplenic hematoma. Adrenals/Urinary Tract: No adrenal hemorrhage or renal injury identified. Bladder is unremarkable. Stomach/Bowel: No dilated large or small bowel loops. No evidence of bowel wall injury or bowel wall inflammation. Stomach is unremarkable. 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 No acute appearing vascular abnormality. No evidence of aortic injury. No periaortic hemorrhage or edema. Extensive aortic atherosclerosis. No enlarged lymph nodes appreciated within the abdomen or pelvis. Reproductive: Uterus and bilateral adnexa are unremarkable. Other: No free fluid or hemorrhage within the abdomen or pelvis. No free intraperitoneal air. Musculoskeletal: Degenerative spondylosis throughout the scoliotic lumbar spine, at least moderate in degree.  Old healed fracture of the RIGHT inferior pubic ramus. No acute appearing osseous abnormality. IMPRESSION: 1. Slightly displaced fractures of the RIGHT lateral fifth and sixth ribs. Additional acute slightly displaced fractures of the posterolateral RIGHT ninth and tenth ribs. Additional deformities of the posterior RIGHT ninth and tenth ribs are of uncertain age. 2. RIGHT-sided pneumothorax, moderate in size, now most prominently seen at the RIGHT lung base with patient in a supine position. 3. Lung contusion and/or atelectasis at the RIGHT lung base. No pleural effusion or hemothorax seen. 4. Mild compression fracture deformity of the T7 vertebral body, of uncertain age but favored to be chronic. No associated vertebral body retropulsion. 5. No acute intra-abdominal or intrapelvic abnormality. No evidence of solid organ injury. No free fluid or hemorrhage within the abdomen or pelvis. No evidence of bowel injury. 6. 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 No acute appearing vascular abnormality. No periaortic hemorrhage or edema. 7. Additional chronic/incidental findings detailed above. Aortic Atherosclerosis (ICD10-I70.0). Electronically Signed   By: Franki Cabot M.D.   On: 02/14/2018 21:06   Ct Cervical Spine Wo Contrast  Result Date: 02/14/2018 CLINICAL DATA:  Fall, dementia. EXAM: CT HEAD WITHOUT CONTRAST CT CERVICAL SPINE WITHOUT CONTRAST TECHNIQUE: Multidetector CT imaging of the head and cervical spine was performed following the standard protocol without intravenous contrast. Multiplanar CT image reconstructions of the cervical spine were also generated. COMPARISON:  None. FINDINGS: CT HEAD FINDINGS Brain: Mild generalized age related parenchymal volume loss with commensurate dilatation of the ventricles and sulci. Mild chronic small vessel ischemic changes within the deep periventricular white matter regions. No mass,  hemorrhage, edema or other evidence of acute parenchymal abnormality. No extra-axial hemorrhage. Vascular: No acute findings. Skull: Normal. Negative for fracture or focal lesion. Sinuses/Orbits: No acute finding. Other: None. CT CERVICAL SPINE FINDINGS Alignment: No evidence of acute vertebral body subluxation. Skull base and vertebrae: No fracture line or displaced fracture fragment appreciated. Facet joints appear intact and normally aligned. Soft tissues and spinal canal: No prevertebral fluid or swelling. No visible canal hematoma. Disc levels: Disc desiccations throughout the cervical spine, with associated osseous spurring and disc-osteophytic bulges at multiple levels, resulting in mild to moderate central canal stenoses at the C3-4 through C6-7 levels. Upper chest: Pneumothorax at the RIGHT lung apex, as seen on earlier chest x-ray and chest CT. Other: Bilateral carotid atherosclerosis. IMPRESSION: 1. No acute intracranial abnormality. No intracranial mass, hemorrhage or edema. No skull fracture. Atrophy and chronic ischemic changes in the white matter. 2. No fracture or acute subluxation within the cervical spine. Degenerative changes within the cervical spine, as detailed above. 3. Pneumothorax at the RIGHT lung  apex, as seen on earlier chest x-ray and chest CT. Electronically Signed   By: Franki Cabot M.D.   On: 02/14/2018 21:11   Ct Abdomen Pelvis W Contrast  Result Date: 02/14/2018 CLINICAL DATA:  Fall, RIGHT-sided rib pain. History of hypertension, dementia. EXAM: CT CHEST, ABDOMEN, AND PELVIS WITH CONTRAST TECHNIQUE: Multidetector CT imaging of the chest, abdomen and pelvis was performed following the standard protocol during bolus administration of intravenous contrast. CONTRAST:  149m ISOVUE-300 IOPAMIDOL (ISOVUE-300) INJECTION 61% COMPARISON:  Chest x-ray from earlier same day. FINDINGS: CT CHEST FINDINGS Cardiovascular: No thoracic aortic aneurysm or evidence of aortic dissection.  Scattered aortic atherosclerosis. Borderline cardiomegaly. No pericardial effusion. Mediastinum/Nodes: No hemorrhage or edema appreciated within the mediastinum. No mass or enlarged lymph nodes seen. Esophagus is unremarkable. Trachea and central bronchi are unremarkable. Lungs/Pleura: Pneumothorax, small to moderate in size, now most prominently seen at the RIGHT lung base with patient in supine positioning. Lung contusion and/or atelectasis at the RIGHT lung base. No pleural effusion or hemothorax seen. Mild scarring/atelectasis at the LEFT lung base. Musculoskeletal: Slightly displaced fractures of the RIGHT lateral fifth and sixth ribs. Additional slightly displaced fractures of the posterolateral RIGHT ninth and tenth ribs. Additional slight deformities of the posterior RIGHT ninth and tenth ribs, of indeterminate age. Mild compression fracture deformity of the T7 vertebral body, of uncertain age. No associated vertebral body retropulsion. CT ABDOMEN PELVIS FINDINGS Hepatobiliary: No hepatic injury or perihepatic hematoma. Gallbladder is unremarkable Pancreas: Calcifications throughout the pancreas, indicating chronic pancreatitis. No acute findings. Spleen: No splenic injury or perisplenic hematoma. Adrenals/Urinary Tract: No adrenal hemorrhage or renal injury identified. Bladder is unremarkable. Stomach/Bowel: No dilated large or small bowel loops. No evidence of bowel wall injury or bowel wall inflammation. Stomach is unremarkable. 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 No acute appearing vascular abnormality. No evidence of aortic injury. No periaortic hemorrhage or edema. Extensive aortic atherosclerosis. No enlarged lymph nodes appreciated within the abdomen or pelvis. Reproductive: Uterus and bilateral adnexa are unremarkable. Other: No free fluid or hemorrhage within the abdomen or pelvis. No free  intraperitoneal air. Musculoskeletal: Degenerative spondylosis throughout the scoliotic lumbar spine, at least moderate in degree. Old healed fracture of the RIGHT inferior pubic ramus. No acute appearing osseous abnormality. IMPRESSION: 1. Slightly displaced fractures of the RIGHT lateral fifth and sixth ribs. Additional acute slightly displaced fractures of the posterolateral RIGHT ninth and tenth ribs. Additional deformities of the posterior RIGHT ninth and tenth ribs are of uncertain age. 2. RIGHT-sided pneumothorax, moderate in size, now most prominently seen at the RIGHT lung base with patient in a supine position. 3. Lung contusion and/or atelectasis at the RIGHT lung base. No pleural effusion or hemothorax seen. 4. Mild compression fracture deformity of the T7 vertebral body, of uncertain age but favored to be chronic. No associated vertebral body retropulsion. 5. No acute intra-abdominal or intrapelvic abnormality. No evidence of solid organ injury. No free fluid or hemorrhage within the abdomen or pelvis. No evidence of bowel injury. 6. 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 No acute appearing vascular abnormality. No periaortic hemorrhage or edema. 7. Additional chronic/incidental findings detailed above. Aortic Atherosclerosis (ICD10-I70.0). Electronically Signed   By: SFranki CabotM.D.   On: 02/14/2018 21:06   Dg Chest Portable 1 View  Result Date: 02/14/2018 CLINICAL DATA:  Chest tube placement. EXAM:  PORTABLE CHEST 1 VIEW COMPARISON:  Chest radiograph February 14, 2018 FINDINGS: New RIGHT pigtailed chest tube, trace residual RIGHT pneumothorax. Cardiac silhouette is moderately enlarged. Tortuous aorta. Diffuse interstitial prominence with RIGHT mid lung zone patchy airspace opacity in RIGHT lung base strandy densities. Small LEFT pleural effusion. Biapical pleural thickening. Acute RIGHT rib fractures better  demonstrated on today's CT. IMPRESSION: 1. New RIGHT chest tube, trace residual pneumothorax. 2. RIGHT mid lung zone atelectasis versus contusion. RIGHT lung base atelectasis. Small LEFT pleural effusion. 3. Cardiomegaly and similar interstitial prominence. Electronically Signed   By: Elon Alas M.D.   On: 02/14/2018 22:42   Dg Chest Portable 1 View  Result Date: 02/14/2018 CLINICAL DATA:  Fall today, RIGHT-sided rib pain. EXAM: PORTABLE CHEST 1 VIEW COMPARISON:  Chest x-ray dated 06/30/2003 FINDINGS: Heart size is upper normal. Mediastinal contours are within normal limits, given the evidence of age-related aortic ectasia. Moderate-sized RIGHT pneumothorax. LEFT lung is clear. Multiple suspected RIGHT-sided rib fractures. IMPRESSION: 1. RIGHT-sided pneumothorax, moderate in size. No significant leftward shift of the mediastinal structures. 2. Suspect multiple RIGHT-sided rib fractures. These results were called by telephone at the time of interpretation on 02/14/2018 at 7:26 pm to the emergency room physician, who verbally acknowledged these results. Electronically Signed   By: Franki Cabot M.D.   On: 02/14/2018 19:27    Review of Systems  Unable to perform ROS: Age  Cardiovascular: Positive for chest pain (right side).    Blood pressure (!) 176/74, pulse 74, temperature 97.9 F (36.6 C), temperature source Oral, resp. rate 19, SpO2 98 %. Physical Exam  Vitals reviewed. Constitutional: She is oriented to person, place, and time. She appears well-developed and well-nourished.  HENT:  Head: Normocephalic and atraumatic.  Right Ear: External ear normal.  Left Ear: External ear normal.  Nose: Nose normal.  Eyes: EOM are normal. No scleral icterus.  Neck: Neck supple. No spinous process tenderness and no muscular tenderness present.  Cardiovascular: Normal rate, regular rhythm, normal heart sounds and intact distal pulses.  Respiratory: Effort normal and breath sounds normal. She  exhibits tenderness (right lateral).  Right pigtail in place  GI: Soft. Bowel sounds are normal. There is no tenderness.    Musculoskeletal: She exhibits no edema or tenderness.  Lymphadenopathy:    She has no cervical adenopathy.  Neurological: She is alert and oriented to person, place, and time.  Skin: Skin is warm and dry.  Psychiatric: Her behavior is normal.     Assessment/Plan Fall Rib fx/ptx-pigtail in place, repeat cxr in am, will water seal when no air leak, pulm toilet and pain control Restart home meds Recheck labs in am lovenox scds Pt/ot for eventual dispo   Rolm Bookbinder, MD 02/15/2018, 1:00 AM

## 2018-02-15 NOTE — Progress Notes (Signed)
Patient ID: Crystal Benson, female   DOB: 1920/10/21, 84 y.o.   MRN: [id number]       Subjective: Very HOH!!!  Complains of chest pain especially when moving.  Denies SOB.  Objective: Vital signs in last 24 hours: Temp:  [97.9 F (36.6 C)-98.4 F (36.9 C)] 98.3 F (36.8 C) (11/23 0753) Pulse Rate:  [53-77] 71 (11/23 0753) Resp:  [16-26] 16 (11/23 0753) BP: (165-203)/(64-100) 188/75 (11/23 0753) SpO2:  [87 %-100 %] 95 % (11/23 0753)    Intake/Output from previous day: 11/22 0701 - 11/23 0700 In: 500.9 [IV Piggyback:500.9] Out: -  Intake/Output this shift: Total I/O In: 120 [P.O.:120] Out: 450 [Urine:450]  PE: Gen: NAD Heart: regular Lungs: CTAB, right chest tube in place with only 30 cc of output.  No airleak. Abd: soft, NT, ND, +BS  Lab Results:  Recent Labs    02/14/18 1909 02/15/18 0356  WBC 10.0 9.3  HGB 11.0* 10.8*  HCT 34.6* 33.6*  PLT 213 205   BMET Recent Labs    02/14/18 1909 02/15/18 0356  NA 142 135  K 4.4 4.6  CL 112* 106  CO2 23 23  GLUCOSE 123* 166*  BUN 32* 27*  CREATININE 1.34* 1.32*  CALCIUM 9.8 9.7   PT/INR No results for input(s): LABPROT, INR in the last 72 hours. CMP     Component Value Date/Time   NA 135 02/15/2018 0356   K 4.6 02/15/2018 0356   CL 106 02/15/2018 0356   CO2 23 02/15/2018 0356   GLUCOSE 166 (H) 02/15/2018 0356   BUN 27 (H) 02/15/2018 0356   CREATININE 1.32 (H) 02/15/2018 0356   CALCIUM 9.7 02/15/2018 0356   PROT 6.8 02/14/2018 1909   ALBUMIN 3.7 02/14/2018 1909   AST 21 02/14/2018 1909   ALT 12 02/14/2018 1909   ALKPHOS 77 02/14/2018 1909   BILITOT 0.4 02/14/2018 1909   GFRNONAA 33 (L) 02/15/2018 0356   GFRAA 38 (L) 02/15/2018 0356   Lipase  No results found for: LIPASE     Studies/Results: Ct Head Wo Contrast  Result Date: 02/14/2018 CLINICAL DATA:  Fall, dementia. EXAM: CT HEAD WITHOUT CONTRAST CT CERVICAL SPINE WITHOUT CONTRAST TECHNIQUE: Multidetector CT imaging of the head and cervical  spine was performed following the standard protocol without intravenous contrast. Multiplanar CT image reconstructions of the cervical spine were also generated. COMPARISON:  None. FINDINGS: CT HEAD FINDINGS Brain: Mild generalized age related parenchymal volume loss with commensurate dilatation of the ventricles and sulci. Mild chronic small vessel ischemic changes within the deep periventricular white matter regions. No mass, hemorrhage, edema or other evidence of acute parenchymal abnormality. No extra-axial hemorrhage. Vascular: No acute findings. Skull: Normal. Negative for fracture or focal lesion. Sinuses/Orbits: No acute finding. Other: None. CT CERVICAL SPINE FINDINGS Alignment: No evidence of acute vertebral body subluxation. Skull base and vertebrae: No fracture line or displaced fracture fragment appreciated. Facet joints appear intact and normally aligned. Soft tissues and spinal canal: No prevertebral fluid or swelling. No visible canal hematoma. Disc levels: Disc desiccations throughout the cervical spine, with associated osseous spurring and disc-osteophytic bulges at multiple levels, resulting in mild to moderate central canal stenoses at the C3-4 through C6-7 levels. Upper chest: Pneumothorax at the RIGHT lung apex, as seen on earlier chest x-ray and chest CT. Other: Bilateral carotid atherosclerosis. IMPRESSION: 1. No acute intracranial abnormality. No intracranial mass, hemorrhage or edema. No skull fracture. Atrophy and chronic ischemic changes in the white matter. 2. No fracture or  acute subluxation within the cervical spine. Degenerative changes within the cervical spine, as detailed above. 3. Pneumothorax at the RIGHT lung apex, as seen on earlier chest x-ray and chest CT. Electronically Signed   By: Franki Cabot M.D.   On: 02/14/2018 21:11   Ct Chest W Contrast  Result Date: 02/14/2018 CLINICAL DATA:  Fall, RIGHT-sided rib pain. History of hypertension, dementia. EXAM: CT CHEST,  ABDOMEN, AND PELVIS WITH CONTRAST TECHNIQUE: Multidetector CT imaging of the chest, abdomen and pelvis was performed following the standard protocol during bolus administration of intravenous contrast. CONTRAST:  132mL ISOVUE-300 IOPAMIDOL (ISOVUE-300) INJECTION 61% COMPARISON:  Chest x-ray from earlier same day. FINDINGS: CT CHEST FINDINGS Cardiovascular: No thoracic aortic aneurysm or evidence of aortic dissection. Scattered aortic atherosclerosis. Borderline cardiomegaly. No pericardial effusion. Mediastinum/Nodes: No hemorrhage or edema appreciated within the mediastinum. No mass or enlarged lymph nodes seen. Esophagus is unremarkable. Trachea and central bronchi are unremarkable. Lungs/Pleura: Pneumothorax, small to moderate in size, now most prominently seen at the RIGHT lung base with patient in supine positioning. Lung contusion and/or atelectasis at the RIGHT lung base. No pleural effusion or hemothorax seen. Mild scarring/atelectasis at the LEFT lung base. Musculoskeletal: Slightly displaced fractures of the RIGHT lateral fifth and sixth ribs. Additional slightly displaced fractures of the posterolateral RIGHT ninth and tenth ribs. Additional slight deformities of the posterior RIGHT ninth and tenth ribs, of indeterminate age. Mild compression fracture deformity of the T7 vertebral body, of uncertain age. No associated vertebral body retropulsion. CT ABDOMEN PELVIS FINDINGS Hepatobiliary: No hepatic injury or perihepatic hematoma. Gallbladder is unremarkable Pancreas: Calcifications throughout the pancreas, indicating chronic pancreatitis. No acute findings. Spleen: No splenic injury or perisplenic hematoma. Adrenals/Urinary Tract: No adrenal hemorrhage or renal injury identified. Bladder is unremarkable. Stomach/Bowel: No dilated large or small bowel loops. No evidence of bowel wall injury or bowel wall inflammation. Stomach is unremarkable. Vascular/Lymphatic: Saccular aneurysm of the infrarenal  abdominal aorta, measuring 5 x 5 cm, with large amount of associated mural thrombus. No acute appearing vascular abnormality. No evidence of aortic injury. No periaortic hemorrhage or edema. Extensive aortic atherosclerosis. No enlarged lymph nodes appreciated within the abdomen or pelvis. Reproductive: Uterus and bilateral adnexa are unremarkable. Other: No free fluid or hemorrhage within the abdomen or pelvis. No free intraperitoneal air. Musculoskeletal: Degenerative spondylosis throughout the scoliotic lumbar spine, at least moderate in degree. Old healed fracture of the RIGHT inferior pubic ramus. No acute appearing osseous abnormality. IMPRESSION: 1. Slightly displaced fractures of the RIGHT lateral fifth and sixth ribs. Additional acute slightly displaced fractures of the posterolateral RIGHT ninth and tenth ribs. Additional deformities of the posterior RIGHT ninth and tenth ribs are of uncertain age. 2. RIGHT-sided pneumothorax, moderate in size, now most prominently seen at the RIGHT lung base with patient in a supine position. 3. Lung contusion and/or atelectasis at the RIGHT lung base. No pleural effusion or hemothorax seen. 4. Mild compression fracture deformity of the T7 vertebral body, of uncertain age but favored to be chronic. No associated vertebral body retropulsion. 5. No acute intra-abdominal or intrapelvic abnormality. No evidence of solid organ injury. No free fluid or hemorrhage within the abdomen or pelvis. No evidence of bowel injury. 6. 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 No acute appearing vascular abnormality. No periaortic hemorrhage or edema. 7. Additional chronic/incidental findings detailed above. Aortic Atherosclerosis (ICD10-I70.0). Electronically Signed   By: Franki Cabot M.D.   On:  02/14/2018 21:06   Ct Cervical Spine Wo Contrast  Result Date: 02/14/2018 CLINICAL DATA:  Fall, dementia. EXAM:  CT HEAD WITHOUT CONTRAST CT CERVICAL SPINE WITHOUT CONTRAST TECHNIQUE: Multidetector CT imaging of the head and cervical spine was performed following the standard protocol without intravenous contrast. Multiplanar CT image reconstructions of the cervical spine were also generated. COMPARISON:  None. FINDINGS: CT HEAD FINDINGS Brain: Mild generalized age related parenchymal volume loss with commensurate dilatation of the ventricles and sulci. Mild chronic small vessel ischemic changes within the deep periventricular white matter regions. No mass, hemorrhage, edema or other evidence of acute parenchymal abnormality. No extra-axial hemorrhage. Vascular: No acute findings. Skull: Normal. Negative for fracture or focal lesion. Sinuses/Orbits: No acute finding. Other: None. CT CERVICAL SPINE FINDINGS Alignment: No evidence of acute vertebral body subluxation. Skull base and vertebrae: No fracture line or displaced fracture fragment appreciated. Facet joints appear intact and normally aligned. Soft tissues and spinal canal: No prevertebral fluid or swelling. No visible canal hematoma. Disc levels: Disc desiccations throughout the cervical spine, with associated osseous spurring and disc-osteophytic bulges at multiple levels, resulting in mild to moderate central canal stenoses at the C3-4 through C6-7 levels. Upper chest: Pneumothorax at the RIGHT lung apex, as seen on earlier chest x-ray and chest CT. Other: Bilateral carotid atherosclerosis. IMPRESSION: 1. No acute intracranial abnormality. No intracranial mass, hemorrhage or edema. No skull fracture. Atrophy and chronic ischemic changes in the white matter. 2. No fracture or acute subluxation within the cervical spine. Degenerative changes within the cervical spine, as detailed above. 3. Pneumothorax at the RIGHT lung apex, as seen on earlier chest x-ray and chest CT. Electronically Signed   By: Franki Cabot M.D.   On: 02/14/2018 21:11   Ct Abdomen Pelvis W  Contrast  Result Date: 02/14/2018 CLINICAL DATA:  Fall, RIGHT-sided rib pain. History of hypertension, dementia. EXAM: CT CHEST, ABDOMEN, AND PELVIS WITH CONTRAST TECHNIQUE: Multidetector CT imaging of the chest, abdomen and pelvis was performed following the standard protocol during bolus administration of intravenous contrast. CONTRAST:  154mL ISOVUE-300 IOPAMIDOL (ISOVUE-300) INJECTION 61% COMPARISON:  Chest x-ray from earlier same day. FINDINGS: CT CHEST FINDINGS Cardiovascular: No thoracic aortic aneurysm or evidence of aortic dissection. Scattered aortic atherosclerosis. Borderline cardiomegaly. No pericardial effusion. Mediastinum/Nodes: No hemorrhage or edema appreciated within the mediastinum. No mass or enlarged lymph nodes seen. Esophagus is unremarkable. Trachea and central bronchi are unremarkable. Lungs/Pleura: Pneumothorax, small to moderate in size, now most prominently seen at the RIGHT lung base with patient in supine positioning. Lung contusion and/or atelectasis at the RIGHT lung base. No pleural effusion or hemothorax seen. Mild scarring/atelectasis at the LEFT lung base. Musculoskeletal: Slightly displaced fractures of the RIGHT lateral fifth and sixth ribs. Additional slightly displaced fractures of the posterolateral RIGHT ninth and tenth ribs. Additional slight deformities of the posterior RIGHT ninth and tenth ribs, of indeterminate age. Mild compression fracture deformity of the T7 vertebral body, of uncertain age. No associated vertebral body retropulsion. CT ABDOMEN PELVIS FINDINGS Hepatobiliary: No hepatic injury or perihepatic hematoma. Gallbladder is unremarkable Pancreas: Calcifications throughout the pancreas, indicating chronic pancreatitis. No acute findings. Spleen: No splenic injury or perisplenic hematoma. Adrenals/Urinary Tract: No adrenal hemorrhage or renal injury identified. Bladder is unremarkable. Stomach/Bowel: No dilated large or small bowel loops. No evidence of  bowel wall injury or bowel wall inflammation. Stomach is unremarkable. 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 No acute  appearing vascular abnormality. No evidence of aortic injury. No periaortic hemorrhage or edema. Extensive aortic atherosclerosis. No enlarged lymph nodes appreciated within the abdomen or pelvis. Reproductive: Uterus and bilateral adnexa are unremarkable. Other: No free fluid or hemorrhage within the abdomen or pelvis. No free intraperitoneal air. Musculoskeletal: Degenerative spondylosis throughout the scoliotic lumbar spine, at least moderate in degree. Old healed fracture of the RIGHT inferior pubic ramus. No acute appearing osseous abnormality. IMPRESSION: 1. Slightly displaced fractures of the RIGHT lateral fifth and sixth ribs. Additional acute slightly displaced fractures of the posterolateral RIGHT ninth and tenth ribs. Additional deformities of the posterior RIGHT ninth and tenth ribs are of uncertain age. 2. RIGHT-sided pneumothorax, moderate in size, now most prominently seen at the RIGHT lung base with patient in a supine position. 3. Lung contusion and/or atelectasis at the RIGHT lung base. No pleural effusion or hemothorax seen. 4. Mild compression fracture deformity of the T7 vertebral body, of uncertain age but favored to be chronic. No associated vertebral body retropulsion. 5. No acute intra-abdominal or intrapelvic abnormality. No evidence of solid organ injury. No free fluid or hemorrhage within the abdomen or pelvis. No evidence of bowel injury. 6. 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 No acute appearing vascular abnormality. No periaortic hemorrhage or edema. 7. Additional chronic/incidental findings detailed above. Aortic Atherosclerosis (ICD10-I70.0). Electronically Signed   By: Franki Cabot  M.D.   On: 02/14/2018 21:06   Dg Chest Portable 1 View  Result Date: 02/14/2018 CLINICAL DATA:  Chest tube placement. EXAM: PORTABLE CHEST 1 VIEW COMPARISON:  Chest radiograph February 14, 2018 FINDINGS: New RIGHT pigtailed chest tube, trace residual RIGHT pneumothorax. Cardiac silhouette is moderately enlarged. Tortuous aorta. Diffuse interstitial prominence with RIGHT mid lung zone patchy airspace opacity in RIGHT lung base strandy densities. Small LEFT pleural effusion. Biapical pleural thickening. Acute RIGHT rib fractures better demonstrated on today's CT. IMPRESSION: 1. New RIGHT chest tube, trace residual pneumothorax. 2. RIGHT mid lung zone atelectasis versus contusion. RIGHT lung base atelectasis. Small LEFT pleural effusion. 3. Cardiomegaly and similar interstitial prominence. Electronically Signed   By: Elon Alas M.D.   On: 02/14/2018 22:42   Dg Chest Portable 1 View  Result Date: 02/14/2018 CLINICAL DATA:  Fall today, RIGHT-sided rib pain. EXAM: PORTABLE CHEST 1 VIEW COMPARISON:  Chest x-ray dated 06/30/2003 FINDINGS: Heart size is upper normal. Mediastinal contours are within normal limits, given the evidence of age-related aortic ectasia. Moderate-sized RIGHT pneumothorax. LEFT lung is clear. Multiple suspected RIGHT-sided rib fractures. IMPRESSION: 1. RIGHT-sided pneumothorax, moderate in size. No significant leftward shift of the mediastinal structures. 2. Suspect multiple RIGHT-sided rib fractures. These results were called by telephone at the time of interpretation on 02/14/2018 at 7:26 pm to the emergency room physician, who verbally acknowledged these results. Electronically Signed   By: Franki Cabot M.D.   On: 02/14/2018 19:27    Anti-infectives: Anti-infectives (From admission, onward)   None       Assessment/Plan Fall Rib fx (5,6,9,10)/ptx-pigtail in place, repeat cxr in am, will water seal when no air leak, pulm toilet and pain control.  PT/OT HTN - cozaar  restarted Infrarenal AAA - 5x5cm with mural clot noted, does not appear acute, outpatient follow up CRI - cr 1.3 FEN - IVFs/HH diet VTE - SCDs/Lovenox ID - none   LOS: 0 days    Henreitta Cea , [facility] 02/15/2018, 10:12 AM Pager: [phone]

## 2018-02-15 NOTE — Evaluation (Signed)
Physical Therapy Evaluation Patient Details Name: Crystal Benson MRN: [id number] DOB: [birth year] Today's Date: 02/15/2018   History of Present Illness  Pt is a 84 y.o. female who fell at her [facility]) sustaining R rib fxs and R pneumothorax.   Clinical Impression  Pt admitted with above diagnosis. Pt currently with functional limitations due to the deficits listed below (see PT Problem List). PTA pt resided in [place], ambulating with rollator. On eval pt mobility significantly limited by pain. Pt initially hesitant to participate in therapy but agreeable with encouragement and full disclosure of what therapy eval/mobility would entail. She required +2 max assist bed mobility. She tolerated sitting EOB x 5 minutes min assist. Pt on 2 L O2 throughout session with SpO2 > 90%.  Upon return to supine, pt in good spirits and appeared more comfortable. Pt will benefit from skilled PT to increase their independence and safety with mobility to allow discharge to the venue listed below.       Follow Up Recommendations SNF    Equipment Recommendations  Other (comment)(defer to next venue)    Recommendations for Other Services       Precautions / Restrictions Precautions Precautions: Fall;Other (comment) Precaution Comments: R chest tube      Mobility  Bed Mobility Overal bed mobility: Needs Assistance Bed Mobility: Supine to Sit;Sit to Supine     Supine to sit: +2 for physical assistance;Max assist Sit to supine: +2 for physical assistance;Max assist   General bed mobility comments: helicopter method utilized for sup<>sit to assist with minimizing pain with mobility. Bed pad used to pivot and scoot to EOB.  Transfers                 General transfer comment: unable due to pain  Ambulation/Gait             General Gait Details: unable due to pain  Stairs            Wheelchair Mobility    Modified Rankin (Stroke Patients Only)       Balance Overall  balance assessment: Needs assistance Sitting-balance support: Bilateral upper extremity supported;Feet supported Sitting balance-Leahy Scale: Poor Sitting balance - Comments:  reliant on BUE and external support due to pain. Pt requiring min posterior support for sitting EOB x 5 minutes.                                     Pertinent Vitals/Pain Pain Assessment: Faces Faces Pain Scale: Hurts whole lot Pain Location: R flank with mobility Pain Descriptors / Indicators: Guarding;Grimacing;Moaning Pain Intervention(s): Limited activity within patient's tolerance;Repositioned;Premedicated before session    Crystal Benson expects to be discharged to:: Skilled nursing facility                 Additional Comments: [facility] SNF at [facility]    Prior Function Level of Independence: Needs assistance   Gait / Transfers Assistance Needed: amb with rollator to dining room for meals  ADL's / Homemaking Assistance Needed: PCA 3 hrs/day M-F, assisting with cleaning and ADLs. Pt able to complete ADLs (except showering) mod I Sat/Sun with significantly increased time.        Hand Dominance        Extremity/Trunk Assessment   Upper Extremity Assessment Upper Extremity Assessment: Defer to OT evaluation    Lower Extremity Assessment Lower Extremity Assessment: Generalized weakness  Cervical / Trunk Assessment Cervical / Trunk Assessment: Kyphotic  Communication   Communication: HOH  Cognition Arousal/Alertness: Awake/alert Behavior During Therapy: WFL for tasks assessed/performed Overall Cognitive Status: Within Functional Limits for tasks assessed                                        General Comments General comments (skin integrity, edema, etc.): Daughter in law and 2 granddaughters present in room.     Exercises     Assessment/Plan    PT Assessment Patient needs continued PT services  PT Problem List Decreased  strength;Decreased mobility;Decreased activity tolerance;Cardiopulmonary status limiting activity;Pain;Decreased balance       PT Treatment Interventions DME instruction;Therapeutic activities;Gait training;Therapeutic exercise;Patient/family education;Balance training;Functional mobility training    PT Goals (Current goals can be found in the Care Plan section)  Acute Rehab PT Goals Patient Stated Goal: decrease pain PT Goal Formulation: With patient/family Time For Goal Achievement: 03/01/18 Potential to Achieve Goals: Good    Frequency Min 3X/week   Barriers to discharge        Co-evaluation PT/OT/SLP Co-Evaluation/Treatment: Yes Reason for Co-Treatment: For patient/therapist safety;Complexity of the patient's impairments (multi-system involvement);To address functional/ADL transfers           AM-PAC PT "6 Clicks" Mobility  Outcome Measure Help needed turning from your back to your side while in a flat bed without using bedrails?: A Lot Help needed moving from lying on your back to sitting on the side of a flat bed without using bedrails?: A Lot Help needed moving to and from a bed to a chair (including a wheelchair)?: A Lot Help needed standing up from a chair using your arms (e.g., wheelchair or bedside chair)?: A Lot Help needed to walk in hospital room?: Total Help needed climbing 3-5 steps with a railing? : Total 6 Click Score: 10    End of Session Equipment Utilized During Treatment: Oxygen Activity Tolerance: Patient limited by pain Patient left: in bed;with call bell/phone within reach;with family/visitor present;with SCD's reapplied Nurse Communication: Mobility status PT Visit Diagnosis: Other abnormalities of gait and mobility (R26.89);Pain;Difficulty in walking, not elsewhere classified (R26.2)    Time: 1350-1415 PT Time Calculation (min) (ACUTE ONLY): 25 min   Charges:   PT Evaluation $PT Eval Moderate Complexity: 1 Mod          Lorrin Goodell, PT   Office # [phone] Pager [phone]   Crystal Benson 02/15/2018, 3:07 PM

## 2018-02-15 NOTE — Plan of Care (Signed)
  Problem: Activity: Goal: Risk for activity intolerance will decrease Outcome: Progressing   Problem: Coping: Goal: Level of anxiety will decrease Outcome: Progressing   Problem: Pain Managment: Goal: General experience of comfort will improve Outcome: Progressing   Problem: Safety: Goal: Ability to remain free from injury will improve Outcome: Progressing   Problem: Skin Integrity: Goal: Risk for impaired skin integrity will decrease Outcome: Progressing   

## 2018-02-15 NOTE — ED Notes (Signed)
Dr. Donne Hazel and Dr. Leonides Schanz at bedside

## 2018-02-15 NOTE — ED Provider Notes (Signed)
12:50 AM  Pt is a 84 y.o. female that is a transfer from [facility] after she had a fall today.  CT imaging showed displaced fractures of the right lateral fifth and sixth ribs, ninth and 10th ribs with a right-sided pneumothorax, right-sided lung contusion.  Chest tube was placed at [facility].  Dr. Donne Hazel with trauma surgery at bedside for admission.  Patient hemodynamically stable and appears comfortable at this time.   Soul Hackman, Delice Bison, DO 02/15/18 [phone]

## 2018-02-15 NOTE — ED Notes (Signed)
Family at bedside, updated on status and stay.

## 2018-02-16 ENCOUNTER — Inpatient Hospital Stay (HOSPITAL_COMMUNITY): Payer: Medicare HMO

## 2018-02-16 LAB — BASIC METABOLIC PANEL
Anion gap: 8 (ref 5–15)
BUN: 23 mg/dL (ref 8–23)
CALCIUM: 9.8 mg/dL (ref 8.9–10.3)
CO2: 23 mmol/L (ref 22–32)
Chloride: 106 mmol/L (ref 98–111)
Creatinine, Ser: 1.24 mg/dL — ABNORMAL HIGH (ref 0.44–1.00)
GFR calc Af Amer: 41 mL/min — ABNORMAL LOW (ref >60)
GFR, EST NON AFRICAN AMERICAN: 36 mL/min — AB (ref >60)
GLUCOSE: 128 mg/dL — AB (ref 70–99)
Potassium: 3.8 mmol/L (ref 3.5–5.1)
Sodium: 137 mmol/L (ref 135–145)

## 2018-02-16 LAB — CBC
HCT: 35.9 % — ABNORMAL LOW (ref 36.0–46.0)
Hemoglobin: 11.2 g/dL — ABNORMAL LOW (ref 12.0–15.0)
MCH: 30.2 pg (ref 26.0–34.0)
MCHC: 31.2 g/dL (ref 30.0–36.0)
MCV: 96.8 fL (ref 80.0–100.0)
PLATELETS: 233 10*3/uL (ref 150–400)
RBC: 3.71 MIL/uL — ABNORMAL LOW (ref 3.87–5.11)
RDW: 12.9 % (ref 11.5–15.5)
WBC: 10.9 10*3/uL — ABNORMAL HIGH (ref 4.0–10.5)
nRBC: 0 % (ref 0.0–0.2)

## 2018-02-16 MED ORDER — POLYETHYLENE GLYCOL 3350 17 G PO PACK: 17.0000 g | PACK | Freq: Every day | ORAL | Status: DC | PRN

## 2018-02-16 MED ORDER — METHOCARBAMOL 500 MG PO TABS
500.0000 mg | ORAL_TABLET | Freq: Three times a day (TID) | ORAL | Status: DC
  Administered 2018-02-16 – 2018-02-19 (×7): 500 mg via ORAL
  Filled 2018-02-16 (×8): qty 1

## 2018-02-16 NOTE — Progress Notes (Signed)
Subjective/Chief Complaint: Sore, no appetite   Objective: Vital signs in last 24 hours: Temp:  [97.9 F (36.6 C)-98.5 F (36.9 C)] 97.9 F (36.6 C) (11/24 0439) Pulse Rate:  [60-79] 60 (11/24 0439) Resp:  [15-20] 20 (11/24 0439) BP: (179-193)/(73-88) 191/78 (11/24 0439) SpO2:  [92 %-95 %] 94 % (11/24 0439) Weight:  [78.3 kg] 78.3 kg (11/24 0514)    Intake/Output from previous day: 11/23 0701 - 11/24 0700 In: 1048.9 [P.O.:480; I.V.:568.9] Out: 1725 [Urine:1725] Intake/Output this shift: No intake/output data recorded.  Gen: NAD Heart: regular Lungs: CTAB, right chest tube in place  No airleak. Abd: soft, NT, ND, +BS  Lab Results:  Recent Labs    02/15/18 0356 02/16/18 0337  WBC 9.3 10.9*  HGB 10.8* 11.2*  HCT 33.6* 35.9*  PLT 205 233   BMET Recent Labs    02/15/18 0356 02/16/18 0337  NA 135 137  K 4.6 3.8  CL 106 106  CO2 23 23  GLUCOSE 166* 128*  BUN 27* 23  CREATININE 1.32* 1.24*  CALCIUM 9.7 9.8   PT/INR No results for input(s): LABPROT, INR in the last 72 hours. ABG No results for input(s): PHART, HCO3 in the last 72 hours.  Invalid input(s): PCO2, PO2  Studies/Results: Ct Head Wo Contrast  Result Date: 02/14/2018 CLINICAL DATA:  Fall, dementia. EXAM: CT HEAD WITHOUT CONTRAST CT CERVICAL SPINE WITHOUT CONTRAST TECHNIQUE: Multidetector CT imaging of the head and cervical spine was performed following the standard protocol without intravenous contrast. Multiplanar CT image reconstructions of the cervical spine were also generated. COMPARISON:  None. FINDINGS: CT HEAD FINDINGS Brain: Mild generalized age related parenchymal volume loss with commensurate dilatation of the ventricles and sulci. Mild chronic small vessel ischemic changes within the deep periventricular white matter regions. No mass, hemorrhage, edema or other evidence of acute parenchymal abnormality. No extra-axial hemorrhage. Vascular: No acute findings. Skull: Normal. Negative  for fracture or focal lesion. Sinuses/Orbits: No acute finding. Other: None. CT CERVICAL SPINE FINDINGS Alignment: No evidence of acute vertebral body subluxation. Skull base and vertebrae: No fracture line or displaced fracture fragment appreciated. Facet joints appear intact and normally aligned. Soft tissues and spinal canal: No prevertebral fluid or swelling. No visible canal hematoma. Disc levels: Disc desiccations throughout the cervical spine, with associated osseous spurring and disc-osteophytic bulges at multiple levels, resulting in mild to moderate central canal stenoses at the C3-4 through C6-7 levels. Upper chest: Pneumothorax at the RIGHT lung apex, as seen on earlier chest x-ray and chest CT. Other: Bilateral carotid atherosclerosis. IMPRESSION: 1. No acute intracranial abnormality. No intracranial mass, hemorrhage or edema. No skull fracture. Atrophy and chronic ischemic changes in the white matter. 2. No fracture or acute subluxation within the cervical spine. Degenerative changes within the cervical spine, as detailed above. 3. Pneumothorax at the RIGHT lung apex, as seen on earlier chest x-ray and chest CT. Electronically Signed   By: Franki Cabot M.D.   On: 02/14/2018 21:11   Ct Chest W Contrast  Result Date: 02/14/2018 CLINICAL DATA:  Fall, RIGHT-sided rib pain. History of hypertension, dementia. EXAM: CT CHEST, ABDOMEN, AND PELVIS WITH CONTRAST TECHNIQUE: Multidetector CT imaging of the chest, abdomen and pelvis was performed following the standard protocol during bolus administration of intravenous contrast. CONTRAST:  148mL ISOVUE-300 IOPAMIDOL (ISOVUE-300) INJECTION 61% COMPARISON:  Chest x-ray from earlier same day. FINDINGS: CT CHEST FINDINGS Cardiovascular: No thoracic aortic aneurysm or evidence of aortic dissection. Scattered aortic atherosclerosis. Borderline cardiomegaly. No pericardial effusion. Mediastinum/Nodes:  No hemorrhage or edema appreciated within the mediastinum. No  mass or enlarged lymph nodes seen. Esophagus is unremarkable. Trachea and central bronchi are unremarkable. Lungs/Pleura: Pneumothorax, small to moderate in size, now most prominently seen at the RIGHT lung base with patient in supine positioning. Lung contusion and/or atelectasis at the RIGHT lung base. No pleural effusion or hemothorax seen. Mild scarring/atelectasis at the LEFT lung base. Musculoskeletal: Slightly displaced fractures of the RIGHT lateral fifth and sixth ribs. Additional slightly displaced fractures of the posterolateral RIGHT ninth and tenth ribs. Additional slight deformities of the posterior RIGHT ninth and tenth ribs, of indeterminate age. Mild compression fracture deformity of the T7 vertebral body, of uncertain age. No associated vertebral body retropulsion. CT ABDOMEN PELVIS FINDINGS Hepatobiliary: No hepatic injury or perihepatic hematoma. Gallbladder is unremarkable Pancreas: Calcifications throughout the pancreas, indicating chronic pancreatitis. No acute findings. Spleen: No splenic injury or perisplenic hematoma. Adrenals/Urinary Tract: No adrenal hemorrhage or renal injury identified. Bladder is unremarkable. Stomach/Bowel: No dilated large or small bowel loops. No evidence of bowel wall injury or bowel wall inflammation. Stomach is unremarkable. 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 No acute appearing vascular abnormality. No evidence of aortic injury. No periaortic hemorrhage or edema. Extensive aortic atherosclerosis. No enlarged lymph nodes appreciated within the abdomen or pelvis. Reproductive: Uterus and bilateral adnexa are unremarkable. Other: No free fluid or hemorrhage within the abdomen or pelvis. No free intraperitoneal air. Musculoskeletal: Degenerative spondylosis throughout the scoliotic lumbar spine, at least moderate in degree. Old healed fracture of the RIGHT  inferior pubic ramus. No acute appearing osseous abnormality. IMPRESSION: 1. Slightly displaced fractures of the RIGHT lateral fifth and sixth ribs. Additional acute slightly displaced fractures of the posterolateral RIGHT ninth and tenth ribs. Additional deformities of the posterior RIGHT ninth and tenth ribs are of uncertain age. 2. RIGHT-sided pneumothorax, moderate in size, now most prominently seen at the RIGHT lung base with patient in a supine position. 3. Lung contusion and/or atelectasis at the RIGHT lung base. No pleural effusion or hemothorax seen. 4. Mild compression fracture deformity of the T7 vertebral body, of uncertain age but favored to be chronic. No associated vertebral body retropulsion. 5. No acute intra-abdominal or intrapelvic abnormality. No evidence of solid organ injury. No free fluid or hemorrhage within the abdomen or pelvis. No evidence of bowel injury. 6. 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 No acute appearing vascular abnormality. No periaortic hemorrhage or edema. 7. Additional chronic/incidental findings detailed above. Aortic Atherosclerosis (ICD10-I70.0). Electronically Signed   By: Franki Cabot M.D.   On: 02/14/2018 21:06   Ct Cervical Spine Wo Contrast  Result Date: 02/14/2018 CLINICAL DATA:  Fall, dementia. EXAM: CT HEAD WITHOUT CONTRAST CT CERVICAL SPINE WITHOUT CONTRAST TECHNIQUE: Multidetector CT imaging of the head and cervical spine was performed following the standard protocol without intravenous contrast. Multiplanar CT image reconstructions of the cervical spine were also generated. COMPARISON:  None. FINDINGS: CT HEAD FINDINGS Brain: Mild generalized age related parenchymal volume loss with commensurate dilatation of the ventricles and sulci. Mild chronic small vessel ischemic changes within the deep periventricular white matter regions. No mass, hemorrhage, edema or other  evidence of acute parenchymal abnormality. No extra-axial hemorrhage. Vascular: No acute findings. Skull: Normal. Negative for fracture or focal lesion. Sinuses/Orbits: No acute finding. Other: None. CT CERVICAL SPINE FINDINGS Alignment: No evidence of  acute vertebral body subluxation. Skull base and vertebrae: No fracture line or displaced fracture fragment appreciated. Facet joints appear intact and normally aligned. Soft tissues and spinal canal: No prevertebral fluid or swelling. No visible canal hematoma. Disc levels: Disc desiccations throughout the cervical spine, with associated osseous spurring and disc-osteophytic bulges at multiple levels, resulting in mild to moderate central canal stenoses at the C3-4 through C6-7 levels. Upper chest: Pneumothorax at the RIGHT lung apex, as seen on earlier chest x-ray and chest CT. Other: Bilateral carotid atherosclerosis. IMPRESSION: 1. No acute intracranial abnormality. No intracranial mass, hemorrhage or edema. No skull fracture. Atrophy and chronic ischemic changes in the white matter. 2. No fracture or acute subluxation within the cervical spine. Degenerative changes within the cervical spine, as detailed above. 3. Pneumothorax at the RIGHT lung apex, as seen on earlier chest x-ray and chest CT. Electronically Signed   By: Franki Cabot M.D.   On: 02/14/2018 21:11   Ct Abdomen Pelvis W Contrast  Result Date: 02/14/2018 CLINICAL DATA:  Fall, RIGHT-sided rib pain. History of hypertension, dementia. EXAM: CT CHEST, ABDOMEN, AND PELVIS WITH CONTRAST TECHNIQUE: Multidetector CT imaging of the chest, abdomen and pelvis was performed following the standard protocol during bolus administration of intravenous contrast. CONTRAST:  162mL ISOVUE-300 IOPAMIDOL (ISOVUE-300) INJECTION 61% COMPARISON:  Chest x-ray from earlier same day. FINDINGS: CT CHEST FINDINGS Cardiovascular: No thoracic aortic aneurysm or evidence of aortic dissection. Scattered aortic atherosclerosis.  Borderline cardiomegaly. No pericardial effusion. Mediastinum/Nodes: No hemorrhage or edema appreciated within the mediastinum. No mass or enlarged lymph nodes seen. Esophagus is unremarkable. Trachea and central bronchi are unremarkable. Lungs/Pleura: Pneumothorax, small to moderate in size, now most prominently seen at the RIGHT lung base with patient in supine positioning. Lung contusion and/or atelectasis at the RIGHT lung base. No pleural effusion or hemothorax seen. Mild scarring/atelectasis at the LEFT lung base. Musculoskeletal: Slightly displaced fractures of the RIGHT lateral fifth and sixth ribs. Additional slightly displaced fractures of the posterolateral RIGHT ninth and tenth ribs. Additional slight deformities of the posterior RIGHT ninth and tenth ribs, of indeterminate age. Mild compression fracture deformity of the T7 vertebral body, of uncertain age. No associated vertebral body retropulsion. CT ABDOMEN PELVIS FINDINGS Hepatobiliary: No hepatic injury or perihepatic hematoma. Gallbladder is unremarkable Pancreas: Calcifications throughout the pancreas, indicating chronic pancreatitis. No acute findings. Spleen: No splenic injury or perisplenic hematoma. Adrenals/Urinary Tract: No adrenal hemorrhage or renal injury identified. Bladder is unremarkable. Stomach/Bowel: No dilated large or small bowel loops. No evidence of bowel wall injury or bowel wall inflammation. Stomach is unremarkable. 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 No acute appearing vascular abnormality. No evidence of aortic injury. No periaortic hemorrhage or edema. Extensive aortic atherosclerosis. No enlarged lymph nodes appreciated within the abdomen or pelvis. Reproductive: Uterus and bilateral adnexa are unremarkable. Other: No free fluid or hemorrhage within the abdomen or pelvis. No free intraperitoneal air. Musculoskeletal:  Degenerative spondylosis throughout the scoliotic lumbar spine, at least moderate in degree. Old healed fracture of the RIGHT inferior pubic ramus. No acute appearing osseous abnormality. IMPRESSION: 1. Slightly displaced fractures of the RIGHT lateral fifth and sixth ribs. Additional acute slightly displaced fractures of the posterolateral RIGHT ninth and tenth ribs. Additional deformities of the posterior RIGHT ninth and tenth ribs are of uncertain age. 2. RIGHT-sided pneumothorax, moderate in size, now most prominently seen at the RIGHT lung base with patient in a supine  position. 3. Lung contusion and/or atelectasis at the RIGHT lung base. No pleural effusion or hemothorax seen. 4. Mild compression fracture deformity of the T7 vertebral body, of uncertain age but favored to be chronic. No associated vertebral body retropulsion. 5. No acute intra-abdominal or intrapelvic abnormality. No evidence of solid organ injury. No free fluid or hemorrhage within the abdomen or pelvis. No evidence of bowel injury. 6. 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 No acute appearing vascular abnormality. No periaortic hemorrhage or edema. 7. Additional chronic/incidental findings detailed above. Aortic Atherosclerosis (ICD10-I70.0). Electronically Signed   By: Franki Cabot M.D.   On: 02/14/2018 21:06   Dg Chest Port 1 View  Result Date: 02/16/2018 CLINICAL DATA:  Pneumothorax EXAM: PORTABLE CHEST 1 VIEW COMPARISON:  Portable exam 0718 hours compared to 02/14/2018 FINDINGS: RIGHT lateral pigtail thoracostomy tube again identified. Enlargement of cardiac silhouette. Atherosclerotic calcification thoracic aorta. Mediastinal contours and pulmonary vascularity otherwise normal. Chronic interstitial changes with improved RIGHT lung infiltrates since previous exam. Minimal RIGHT basilar atelectasis. No residual RIGHT pneumothorax identified. IMPRESSION:  RIGHT basilar subsegmental atelectasis with improved RIGHT lung infiltrates. Stable RIGHT thoracostomy tube without pneumothorax. Electronically Signed   By: Lavonia Dana M.D.   On: 02/16/2018 09:17   Dg Chest Portable 1 View  Result Date: 02/14/2018 CLINICAL DATA:  Chest tube placement. EXAM: PORTABLE CHEST 1 VIEW COMPARISON:  Chest radiograph February 14, 2018 FINDINGS: New RIGHT pigtailed chest tube, trace residual RIGHT pneumothorax. Cardiac silhouette is moderately enlarged. Tortuous aorta. Diffuse interstitial prominence with RIGHT mid lung zone patchy airspace opacity in RIGHT lung base strandy densities. Small LEFT pleural effusion. Biapical pleural thickening. Acute RIGHT rib fractures better demonstrated on today's CT. IMPRESSION: 1. New RIGHT chest tube, trace residual pneumothorax. 2. RIGHT mid lung zone atelectasis versus contusion. RIGHT lung base atelectasis. Small LEFT pleural effusion. 3. Cardiomegaly and similar interstitial prominence. Electronically Signed   By: Elon Alas M.D.   On: 02/14/2018 22:42   Dg Chest Portable 1 View  Result Date: 02/14/2018 CLINICAL DATA:  Fall today, RIGHT-sided rib pain. EXAM: PORTABLE CHEST 1 VIEW COMPARISON:  Chest x-ray dated 06/30/2003 FINDINGS: Heart size is upper normal. Mediastinal contours are within normal limits, given the evidence of age-related aortic ectasia. Moderate-sized RIGHT pneumothorax. LEFT lung is clear. Multiple suspected RIGHT-sided rib fractures. IMPRESSION: 1. RIGHT-sided pneumothorax, moderate in size. No significant leftward shift of the mediastinal structures. 2. Suspect multiple RIGHT-sided rib fractures. These results were called by telephone at the time of interpretation on 02/14/2018 at 7:26 pm to the emergency room physician, who verbally acknowledged these results. Electronically Signed   By: Franki Cabot M.D.   On: 02/14/2018 19:27    Anti-infectives: Anti-infectives (From admission, onward)   None       Assessment/Plan: Fall Rib fx (5,6,9,10)/ptx-pigtail in place, water seal today, will check cxr later and in am, pulm toilet and pain control.  PT/OT needs oob HTN - cozaar restarted CRI - cr 1.24 FEN - IVFs/HH diet VTE - SCDs/Lovenox   Rolm Bookbinder 02/16/2018

## 2018-02-16 NOTE — NC FL2 (Signed)
[place] MEDICAID FL2 LEVEL OF CARE SCREENING TOOL     IDENTIFICATION  Patient Name: Crystal Benson Birthdate: [birth year] Sex: female Admission Date (Current Location): 02/14/2018  [facility] and [place] Number:  Herbalist and Address:  [facility], [place] [address] [place], [place] [address]      Provider Number: [id number]  Attending Physician Name and Address:  Md, Trauma, MD  Relative Name and Phone Number:  Haskell Riling, [phone] and Claudius Sis, [phone]    Current Level of Care: Hospital Recommended Level of Care: [place] Prior Approval Number:    Date Approved/Denied: 02/16/18 PASRR Number: [id number]  Discharge Plan: SNF    Current Diagnoses: Patient Active Problem List   Diagnosis Date Noted  . Pneumothorax 02/15/2018    Orientation RESPIRATION BLADDER Height & Weight     Self, Time, Situation, Place  Normal Continent Weight: 172 lb 9.9 oz (78.3 kg) Height:  5\' 4"  (162.6 cm)  BEHAVIORAL SYMPTOMS/MOOD NEUROLOGICAL BOWEL NUTRITION STATUS      Continent Diet(Heart Healthy)  AMBULATORY STATUS COMMUNICATION OF NEEDS Skin   Limited Assist Verbally Normal                       Personal Care Assistance Level of Assistance  Bathing, Feeding, Dressing Bathing Assistance: Maximum assistance Feeding assistance: Maximum assistance Dressing Assistance: Maximum assistance     Functional Limitations Info  Sight, Hearing Sight Info: Impaired(Wears glasses) Hearing Info: Impaired(Hearing impaired)      SPECIAL CARE FACTORS FREQUENCY                       Contractures Contractures Info: Not present    Additional Factors Info  Code Status, Allergies Code Status Info: DNR Allergies Info: Shellfish            Current Medications (02/16/2018):  This is the current hospital active medication list Current Facility-Administered Medications  Medication Dose Route Frequency Provider Last  Rate Last Dose  . acetaminophen (TYLENOL) tablet 1,000 mg  1,000 mg Oral Q6H Rolm Bookbinder, MD   1,000 mg at 02/15/18 1217  . bisacodyl (DULCOLAX) suppository 10 mg  10 mg Rectal Daily PRN Rolm Bookbinder, MD      . docusate sodium (COLACE) capsule 100 mg  100 mg Oral BID Rolm Bookbinder, MD   100 mg at 02/15/18 2102  . enoxaparin (LOVENOX) injection 40 mg  40 mg Subcutaneous Daily Rolm Bookbinder, MD      . losartan (COZAAR) tablet 100 mg  100 mg Oral Daily Rolm Bookbinder, MD   100 mg at 02/15/18 0804  . methocarbamol (ROBAXIN) tablet 500 mg  500 mg Oral TID Rolm Bookbinder, MD      . morphine 2 MG/ML injection 1 mg  1 mg Intravenous Q4H PRN Rolm Bookbinder, MD   1 mg at 02/15/18 2209  . ondansetron (ZOFRAN-ODT) disintegrating tablet 4 mg  4 mg Oral Q6H PRN Rolm Bookbinder, MD       Or  . ondansetron [facility]) injection 4 mg  4 mg Intravenous Q6H PRN Rolm Bookbinder, MD      . oxyCODONE (Oxy IR/ROXICODONE) immediate release tablet 5 mg  5 mg Oral Q4H PRN Rolm Bookbinder, MD      . pantoprazole (PROTONIX) EC tablet 40 mg  40 mg Oral Daily Rolm Bookbinder, MD   40 mg at 02/15/18 0804  . polyethylene glycol (MIRALAX / GLYCOLAX) packet 17 g  17 g  Oral Daily PRN Rolm Bookbinder, MD      . traMADol Veatrice Bourbon) tablet 50 mg  50 mg Oral Q6H PRN Rolm Bookbinder, MD   50 mg at 02/15/18 2102     Discharge Medications: Please see discharge summary for a list of discharge medications.  Relevant Imaging Results:  Relevant Lab Results:   Additional Information SSN: [id number]  Philippa Chester Saraphina Lauderbaugh, LCSWA

## 2018-02-17 ENCOUNTER — Inpatient Hospital Stay (HOSPITAL_COMMUNITY): Payer: Medicare HMO

## 2018-02-17 MED ORDER — ENOXAPARIN SODIUM 30 MG/0.3ML ~~LOC~~ SOLN
30.0000 mg | Freq: Every day | SUBCUTANEOUS | Status: DC
  Administered 2018-02-18 – 2018-02-19 (×2): 30 mg via SUBCUTANEOUS
  Filled 2018-02-17 (×2): qty 0.3

## 2018-02-17 NOTE — Progress Notes (Signed)
[facility] Progress Note     Subjective: CC: pain Patient states she has pain and soreness in R chest that is tolerable but "makes her feel bad". Tolerating diet but decreased appetite, unsure of whether she has had a BM. Unsure of whether she had used IS but when instructed on how to use she stated " I did that yesterday".  Son present at the bedside.   Objective: Vital signs in last 24 hours: Temp:  [98 F (36.7 C)-98.7 F (37.1 C)] 98.6 F (37 C) (11/25 0820) Pulse Rate:  [67-82] 68 (11/25 0820) Resp:  [16-20] 20 (11/25 0439) BP: (172-206)/(84-97) 188/89 (11/25 0820) SpO2:  [96 %-97 %] 96 % (11/25 0820)    Intake/Output from previous day: 11/24 0701 - 11/25 0700 In: 240 [P.O.:240] Out: 1050 [Urine:1000; Chest Tube:50] Intake/Output this shift: Total I/O In: 100 [P.O.:100] Out: -   PE: Gen:  Alert, NAD, pleasant Card:  Regular rate and rhythm, pedal pulses 2+ BL Pulm:  Normal effort, clear to auscultation bilaterally, pulled 500 on IS, CT with SS output and no air leak  Abd: Soft, non-tender, non-distended, bowel sounds present Skin: warm and dry, no rashes  Psych: A&Ox3, somewhat forgetful but easily reoriented  Lab Results:  Recent Labs    02/15/18 0356 02/16/18 0337  WBC 9.3 10.9*  HGB 10.8* 11.2*  HCT 33.6* 35.9*  PLT 205 233   BMET Recent Labs    02/15/18 0356 02/16/18 0337  NA 135 137  K 4.6 3.8  CL 106 106  CO2 23 23  GLUCOSE 166* 128*  BUN 27* 23  CREATININE 1.32* 1.24*  CALCIUM 9.7 9.8   PT/INR No results for input(s): LABPROT, INR in the last 72 hours. CMP     Component Value Date/Time   NA 137 02/16/2018 0337   K 3.8 02/16/2018 0337   CL 106 02/16/2018 0337   CO2 23 02/16/2018 0337   GLUCOSE 128 (H) 02/16/2018 0337   BUN 23 02/16/2018 0337   CREATININE 1.24 (H) 02/16/2018 0337   CALCIUM 9.8 02/16/2018 0337   PROT 6.8 02/14/2018 1909   ALBUMIN 3.7 02/14/2018 1909   AST 21 02/14/2018 1909   ALT 12 02/14/2018 1909    ALKPHOS 77 02/14/2018 1909   BILITOT 0.4 02/14/2018 1909   GFRNONAA 36 (L) 02/16/2018 0337   GFRAA 41 (L) 02/16/2018 0337   Lipase  No results found for: LIPASE     Studies/Results: Dg Chest Port 1 View  Result Date: 02/17/2018 CLINICAL DATA:  Follow-up chest tube EXAM: PORTABLE CHEST 1 VIEW COMPARISON:  02/16/2018 FINDINGS: Cardiac shadow is stable. Aortic calcifications are again seen. The lungs are well aerated bilaterally. Right-sided pigtail catheter is again seen and stable. No pneumothorax is noted. Right-sided rib fractures are seen. Stable mild right basilar atelectasis is noted. IMPRESSION: No pneumothorax on the right.  The remainder of the exam is stable. Electronically Signed   By: Inez Catalina M.D.   On: 02/17/2018 09:03   Dg Chest Port 1 View  Result Date: 02/16/2018 CLINICAL DATA:  Follow-up pneumothorax. EXAM: PORTABLE CHEST 1 VIEW COMPARISON:  February 16, 2018 FINDINGS: The right chest tube remains in place. No pneumothorax identified. Minimal atelectasis and effusion in the right base. No other interval change. IMPRESSION: The right chest tube remains in place. No pneumothorax. Tiny right pleural effusion and atelectasis in the base. Electronically Signed   By: Dorise Bullion III M.D   On: 02/16/2018 18:22   Dg Chest [facility]  1 View  Result Date: 02/16/2018 CLINICAL DATA:  Pneumothorax EXAM: PORTABLE CHEST 1 VIEW COMPARISON:  Portable exam 0718 hours compared to 02/14/2018 FINDINGS: RIGHT lateral pigtail thoracostomy tube again identified. Enlargement of cardiac silhouette. Atherosclerotic calcification thoracic aorta. Mediastinal contours and pulmonary vascularity otherwise normal. Chronic interstitial changes with improved RIGHT lung infiltrates since previous exam. Minimal RIGHT basilar atelectasis. No residual RIGHT pneumothorax identified. IMPRESSION: RIGHT basilar subsegmental atelectasis with improved RIGHT lung infiltrates. Stable RIGHT thoracostomy tube without  pneumothorax. Electronically Signed   By: Lavonia Dana M.D.   On: 02/16/2018 09:17    Anti-infectives: Anti-infectives (From admission, onward)   None       Assessment/Plan Fall Rib fx (5,6,9,10)/ptx- R chest tube removed, repeat CXR this afternoon   - pulm toilet and pain control.   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 Continue IS. PT/OT recommending SNF, I believe patient will be stable for discharge tomorrow if CXR remains stable.   LOS: 2 days    Brigid Re , [facility] 02/17/2018, 9:36 AM Pager: [phone] Mon-Fri 7:00 am-4:30 pm Sat-Sun 7:00 am-11:30 am

## 2018-02-17 NOTE — Progress Notes (Signed)
Physical Therapy Treatment Patient Details Name: Crystal Benson MRN: [id number] DOB: [birth year] Today's Date: 02/17/2018    History of Present Illness Pt is a 84 y.o. female who fell at her ILF [facility]) sustaining R rib fxs and R pneumothorax.     PT Comments    Patient seen for mobility progression. Pt requires mod/max A +2 for bed mobility and functional transfers. Unable to progress to gait training this session due to pain. Son present throughout session. Continue to progress as tolerated with anticipated d/c to SNF for further skilled PT services.     Follow Up Recommendations  SNF     Equipment Recommendations  Other (comment)(TBD next venue)    Recommendations for Other Services       Precautions / Restrictions Precautions Precautions: Fall    Mobility  Bed Mobility Overal bed mobility: Needs Assistance Bed Mobility: Supine to Sit     Supine to sit: +2 for physical assistance;Max assist;HOB elevated     General bed mobility comments: assist to bring hips to EOB with use of bed pad and to elevate trunk into sitting; cues for sequencing and technique to decrease pain  Transfers Overall transfer level: Needs assistance Equipment used: 2 person hand held assist Transfers: Stand Pivot Transfers;Sit to/from Stand Sit to Stand: Max assist;+2 physical assistance;From elevated surface Stand pivot transfers: Mod assist;+2 physical assistance       General transfer comment: assist to power up into standing with use of gait belt and bed pad for hip extension; cues for sequencing, breathing technique, and posture for pivot EOB to recliner   Ambulation/Gait                 Stairs             Wheelchair Mobility    Modified Rankin (Stroke Patients Only)       Balance Overall balance assessment: Needs assistance Sitting-balance support: Bilateral upper extremity supported;Feet supported Sitting balance-Leahy Scale: Poor                                       Cognition Arousal/Alertness: Awake/alert Behavior During Therapy: WFL for tasks assessed/performed Overall Cognitive Status: Within Functional Limits for tasks assessed Area of Impairment: Memory;Problem solving                     Memory: Decreased short-term memory       Problem Solving: Difficulty sequencing;Requires verbal cues;Requires tactile cues General Comments: pt repeating same questions several times during session and did not recall using IS just before session-son present and reports that she has been using IS      Exercises      General Comments General comments (skin integrity, edema, etc.): son present      Pertinent Vitals/Pain Pain Assessment: Faces Faces Pain Scale: Hurts even more Pain Location: ribs with mobility Pain Descriptors / Indicators: Guarding;Grimacing;Sore Pain Intervention(s): Limited activity within patient's tolerance;Monitored during session;Repositioned    Home Living                      Prior Function            PT Goals (current goals can now be found in the care plan section) Acute Rehab PT Goals Patient Stated Goal: decrease pain Progress towards PT goals: Progressing toward goals    Frequency    Min 3X/week  PT Plan Current plan remains appropriate    Co-evaluation              AM-PAC PT "6 Clicks" Mobility   Outcome Measure  Help needed turning from your back to your side while in a flat bed without using bedrails?: A Lot Help needed moving from lying on your back to sitting on the side of a flat bed without using bedrails?: A Lot Help needed moving to and from a bed to a chair (including a wheelchair)?: A Lot Help needed standing up from a chair using your arms (e.g., wheelchair or bedside chair)?: A Lot Help needed to walk in hospital room?: Total Help needed climbing 3-5 steps with a railing? : Total 6 Click Score: 10    End of Session Equipment  Utilized During Treatment: Oxygen;Gait belt;Other (comment)(gait belt high on chest ) Activity Tolerance: Patient limited by pain Patient left: in chair;with call bell/phone within reach;with chair alarm set;with family/visitor present Nurse Communication: Mobility status PT Visit Diagnosis: Other abnormalities of gait and mobility (R26.89);Pain;Difficulty in walking, not elsewhere classified (R26.2) Pain - part of body: (ribs)     Time: 0340-3524 PT Time Calculation (min) (ACUTE ONLY): 24 min  Charges:  $Therapeutic Activity: 23-37 mins                     Crystal Benson, PTA Acute Rehabilitation Services Pager: [phone] Office: [phone]     Crystal Benson 02/17/2018, 4:09 PM

## 2018-02-17 NOTE — Clinical Social Work Note (Signed)
Clinical Social Work Assessment  Patient Details  Name: Crystal Benson MRN: [id number] Date of Birth: 01-28-1921  Date of referral:  02/17/18               Reason for consult:  Discharge Planning                Permission sought to share information with:  Case Manager, Facility Sport and exercise psychologist, Family Supports Permission granted to share information::  Yes, Verbal Permission Granted  Name::     Therapist, sports::  SNFs  Relationship::  Son and POA  Contact Information:  [phone]  Housing/Transportation Living arrangements for the past 2 months:  Skilled Nursing Facility([facility]) Source of Information:  Patient Patient Interpreter Needed:  None Criminal Activity/Legal Involvement Pertinent to Current Situation/Hospitalization:  No - Comment as needed Significant Relationships:  Adult Children Lives with:  Facility Resident Do you feel safe going back to the place where you live?  Yes Need for family participation in patient care:  Yes (Comment)  Care giving concerns:  CSW received referral for possible SNF placement at time of discharge. Spoke with patient and son at bedside regarding possibility of SNF placement . Patient's family   is currently unable to care for her at their home given patient's current needs and fall risk.  Patient is from [place] per son report, and second son Crystal Benson is POA and can be reached at [phone]. They expressed understanding of PT recommendation and are agreeable to SNF placement at time of discharge. CSW to continue to follow and assist with discharge planning needs.     Social Worker assessment / plan:  Spoke with patient and son at bedside concerning possibility of rehab at [facility] before returning home.    Employment status:  Retired Nurse, adult PT Recommendations:  [place] / Referral to community resources:  [place]  Patient/Family's Response to care:   Patient and son  recognize need for rehab before returning home and are agreeable to a SNFand would prefer patient to return to [facility] . CSW explained insurance authorization process. Patient's family reported that they want patient to get stronger to be able to come back home.    Patient/Family's Understanding of and Emotional Response to Diagnosis, Current Treatment, and Prognosis:  Patient/family is realistic regarding therapy needs and expressed being hopeful for SNF placement and return to [facility]. Patient expressed understanding of CSW role and discharge process as well as medical condition. No questions/concerns about plan or treatment.    Emotional Assessment Appearance:  Appears stated age Attitude/Demeanor/Rapport:  Unable to Assess Affect (typically observed):  Unable to Assess Orientation:  Oriented to Place, Oriented to Self Alcohol / Substance use:  Not Applicable Psych involvement (Current and /or in the community):  No (Comment)  Discharge Needs  Concerns to be addressed:  Discharge Planning Concerns Readmission within the last 30 days:  No Current discharge risk:  Dependent with Mobility Barriers to Discharge:  Continued Medical Work up   [employer], LCSW 02/17/2018, 1:36 PM

## 2018-02-18 MED ORDER — POLYETHYLENE GLYCOL 3350 17 G PO PACK
17.0000 g | PACK | Freq: Every day | ORAL | Status: DC
  Administered 2018-02-18 – 2018-02-19 (×2): 17 g via ORAL
  Filled 2018-02-18 (×2): qty 1

## 2018-02-18 NOTE — Progress Notes (Signed)
[facility] Progress Note     Subjective: CC: pain in R side with movement Patient tells me it hurts in her R chest when she moves. Denies SOB. Does not like the food. Denies nausea. Wants to go home soon.   Objective: Vital signs in last 24 hours: Temp:  [97.5 F (36.4 C)-98.6 F (37 C)] 97.6 F (36.4 C) (11/26 0418) Pulse Rate:  [58-72] 58 (11/26 0418) Resp:  [19-20] 19 (11/26 0418) BP: (125-165)/(78-88) 165/79 (11/26 0418) SpO2:  [95 %-97 %] 97 % (11/26 0418)    Intake/Output from previous day: 11/25 0701 - 11/26 0700 In: 250 [P.O.:250] Out: 600 [Urine:600] Intake/Output this shift: No intake/output data recorded.  PE: Gen:  Alert, NAD, pleasant Card:  Regular rate and rhythm, pedal pulses 2+ BL Pulm:  Normal effort, clear to auscultation bilaterally Abd: Soft, non-tender, non-distended, bowel sounds present Skin: warm and dry, no rashes  Psych: A&Ox3, somewhat forgetful but easily reoriented  Lab Results:  Recent Labs    02/16/18 0337  WBC 10.9*  HGB 11.2*  HCT 35.9*  PLT 233   BMET Recent Labs    02/16/18 0337  NA 137  K 3.8  CL 106  CO2 23  GLUCOSE 128*  BUN 23  CREATININE 1.24*  CALCIUM 9.8   PT/INR No results for input(s): LABPROT, INR in the last 72 hours. CMP     Component Value Date/Time   NA 137 02/16/2018 0337   K 3.8 02/16/2018 0337   CL 106 02/16/2018 0337   CO2 23 02/16/2018 0337   GLUCOSE 128 (H) 02/16/2018 0337   BUN 23 02/16/2018 0337   CREATININE 1.24 (H) 02/16/2018 0337   CALCIUM 9.8 02/16/2018 0337   PROT 6.8 02/14/2018 1909   ALBUMIN 3.7 02/14/2018 1909   AST 21 02/14/2018 1909   ALT 12 02/14/2018 1909   ALKPHOS 77 02/14/2018 1909   BILITOT 0.4 02/14/2018 1909   GFRNONAA 36 (L) 02/16/2018 0337   GFRAA 41 (L) 02/16/2018 0337   Lipase  No results found for: LIPASE     Studies/Results: Dg Chest Port 1 View  Result Date: 02/17/2018 CLINICAL DATA:  Right-sided chest pain following chest tube  removed EXAM: PORTABLE CHEST 1 VIEW COMPARISON:  Film from earlier in the same day. FINDINGS: Cardiac shadow is stable in appearance. Aortic calcifications are noted. Recently seen pigtail catheter has been removed. Stable basilar atelectasis on the right is noted. No pneumothorax is seen. Rib fractures on the right are again noted. IMPRESSION: No pneumothorax following chest tube removal. Otherwise stable appearance of the chest. Electronically Signed   By: Inez Catalina M.D.   On: 02/17/2018 14:23   Dg Chest Port 1 View  Result Date: 02/17/2018 CLINICAL DATA:  Follow-up chest tube EXAM: PORTABLE CHEST 1 VIEW COMPARISON:  02/16/2018 FINDINGS: Cardiac shadow is stable. Aortic calcifications are again seen. The lungs are well aerated bilaterally. Right-sided pigtail catheter is again seen and stable. No pneumothorax is noted. Right-sided rib fractures are seen. Stable mild right basilar atelectasis is noted. IMPRESSION: No pneumothorax on the right.  The remainder of the exam is stable. Electronically Signed   By: Inez Catalina M.D.   On: 02/17/2018 09:03   Dg Chest Port 1 View  Result Date: 02/16/2018 CLINICAL DATA:  Follow-up pneumothorax. EXAM: PORTABLE CHEST 1 VIEW COMPARISON:  February 16, 2018 FINDINGS: The right chest tube remains in place. No pneumothorax identified. Minimal atelectasis and effusion in the right base. No other interval change. IMPRESSION:  The right chest tube remains in place. No pneumothorax. Tiny right pleural effusion and atelectasis in the base. Electronically Signed   By: Dorise Bullion III M.D   On: 02/16/2018 18:22    Anti-infectives: Anti-infectives (From admission, onward)   None       Assessment/Plan Fall Rib fx(5,6,9,10)/ptx- R chest tube removed, repeat CXR without PTX  - pulm toilet and pain control.  - PT/OT HTN- cozaar restarted Infrarenal AAA- 5x5cm with mural clot noted, does not appear acute, outpatient follow up CRI- cr 1.24 11/24  FEN- HH  diet VTE- SCDs/Lovenox ID -none  Dispo - stable for discharge to SNF when bed available   LOS: 3 days    Brigid Re , [facility] 02/18/2018, 8:48 AM Pager: [phone] Mon-Fri 7:00 am-4:30 pm Sat-Sun 7:00 am-11:30 am

## 2018-02-18 NOTE — Progress Notes (Signed)
Occupational Therapy Treatment Patient Details Name: Crystal Benson MRN: [id number] DOB: 05/04/1920 Today's Date: 02/18/2018    History of present illness Pt is a 84 y.o. female who fell at her ILF [facility]) sustaining R rib fxs and R pneumothorax.    OT comments  Pt progressing slowly towards OT goals, presents supine in bed agreeable to therapy session. Pt continues to demonstrate limitations due to pain. Pt requiring less assist to perform bed mobility this session, and completing stand pivot transfer using RW this session with two person assist throughout. Pt completing grooming ADL seated EOB with setup assist for task and minguard-minA for upright sitting balance. Continued education on IS use and benefits of being upright/OOB. Feel SNF recommendation remains appropriate at this time. Will continue to follow acutely to progress pt towards established OT goals.   Follow Up Recommendations  SNF;Supervision/Assistance - 24 hour    Equipment Recommendations  Other (comment)(TBD in next venue)          Precautions / Restrictions Precautions Precautions: Fall Restrictions Weight Bearing Restrictions: No       Mobility Bed Mobility Overal bed mobility: Needs Assistance Bed Mobility: Supine to Sit     Supine to sit: HOB elevated;Mod assist;+2 for safety/equipment     General bed mobility comments: cues for LEs towards EOB and for use of bedrails and UEs to assist; assist to elevate trunk with use of HHA; cues to assist with scooting hips towards EOB  Transfers Overall transfer level: Needs assistance Equipment used: Rolling walker (2 wheeled) Transfers: Stand Pivot Transfers;Sit to/from Stand Sit to Stand: +2 physical assistance;From elevated surface;Mod assist Stand pivot transfers: Mod assist;Min assist;+2 physical assistance;+2 safety/equipment       General transfer comment: assist to power up and steady at RW from slightly elevated EOB; steadying assist to take  small steps forward with RW, once nearing recliner pt leaving RW behind and use of UE support on armrest to turn body to sit, +2 physical assist (min-modA) provided throughout     Balance Overall balance assessment: Needs assistance Sitting-balance support: Bilateral upper extremity supported;Feet supported Sitting balance-Leahy Scale: Poor Sitting balance - Comments: reliant on at least single UE support, at times minA for sitting balance to maintain upright posture   Standing balance support: Bilateral upper extremity supported Standing balance-Leahy Scale: Poor Standing balance comment: reliant on UE support and external assist                           ADL either performed or assessed with clinical judgement   ADL Overall ADL's : Needs assistance/impaired     Grooming: Wash/dry face;Sitting;Min guard;Minimal assistance Grooming Details (indicate cue type and reason): minguard-minA for sitting balance EOB                             Functional mobility during ADLs: Moderate assistance;Minimal assistance;+2 for physical assistance;+2 for safety/equipment;Rolling walker(stand pivot transfer) General ADL Comments: pt continues to demonstrate limitations due to pain     Vision       Perception     Praxis      Cognition Arousal/Alertness: Awake/alert Behavior During Therapy: WFL for tasks assessed/performed Overall Cognitive Status: Within Functional Limits for tasks assessed Area of Impairment: Memory;Problem solving                     Memory: Decreased short-term memory  Problem Solving: Difficulty sequencing;Requires verbal cues;Requires tactile cues General Comments: increased time and cues to perform tasks        Exercises     Shoulder Instructions       General Comments sons present during session    Pertinent Vitals/ Pain       Pain Assessment: Faces Faces Pain Scale: Hurts even more Pain Location: R side/ribs with  mobility Pain Descriptors / Indicators: Guarding;Grimacing;Sore Pain Intervention(s): Limited activity within patient's tolerance;Monitored during session;Repositioned  Home Living                                          Prior Functioning/Environment              Frequency  Min 2X/week        Progress Toward Goals  OT Goals(current goals can now be found in the care plan section)  Progress towards OT goals: Progressing toward goals  Acute Rehab OT Goals Patient Stated Goal: decrease pain OT Goal Formulation: With patient/family Time For Goal Achievement: 03/01/18 Potential to Achieve Goals: Good ADL Goals Pt Will Perform Eating: with supervision;sitting Pt Will Perform Grooming: sitting;with supervision Pt Will Transfer to Toilet: with mod assist;ambulating;bedside commode;regular height toilet Pt Will Perform Toileting - Clothing Manipulation and hygiene: with mod assist;sit to/from stand  Plan Discharge plan remains appropriate    Co-evaluation                 AM-PAC OT "6 Clicks" Daily Activity     Outcome Measure   Help from another person eating meals?: A Little Help from another person taking care of personal grooming?: A Lot Help from another person toileting, which includes using toliet, bedpan, or urinal?: Total Help from another person bathing (including washing, rinsing, drying)?: A Lot Help from another person to put on and taking off regular upper body clothing?: A Lot Help from another person to put on and taking off regular lower body clothing?: Total 6 Click Score: 11    End of Session Equipment Utilized During Treatment: Gait belt;Rolling walker  OT Visit Diagnosis: Other abnormalities of gait and mobility (R26.89);Pain Pain - part of body: (ribs)   Activity Tolerance Patient tolerated treatment well   Patient Left in chair;with call bell/phone within reach;with family/visitor present   Nurse Communication  Mobility status        Time: 1410-1436 OT Time Calculation (min): 26 min  Charges: OT General Charges $OT Visit: 1 Visit OT Treatments $Self Care/Home Management : 8-22 mins  Lou Cal, OT Supplemental Rehabilitation Services Pager [phone] Office [phone]    Raymondo Band 02/18/2018, 2:51 PM

## 2018-02-18 NOTE — Progress Notes (Signed)
Physical Therapy Treatment Patient Details Name: Crystal Benson MRN: [id number] DOB: [birth year] Today's Date: 02/18/2018    History of Present Illness Pt is a 84 y.o. female who fell at her ILF [facility]) sustaining R rib fxs and R pneumothorax.     PT Comments    Pt prefers to remain in bed but able to be encouraged to get up. Mod A +2 to get to EOB as well as to stand and pivot to chair taking a few steps. Tried to get pt to ambulate further but she was focused on stopping at chair. Discussed proper breathing as well as performing there ex. PT will continue to follow.    Follow Up Recommendations  SNF     Equipment Recommendations  Other (comment)(TBD next venue)    Recommendations for Other Services       Precautions / Restrictions Precautions Precautions: Fall Restrictions Weight Bearing Restrictions: No    Mobility  Bed Mobility Overal bed mobility: Needs Assistance Bed Mobility: Supine to Sit     Supine to sit: HOB elevated;Mod assist;+2 for safety/equipment     General bed mobility comments: cues for LEs towards EOB and for use of bedrails and UEs to assist; assist to elevate trunk with use of HHA; cues to assist with scooting hips towards EOB  Transfers Overall transfer level: Needs assistance Equipment used: Rolling walker (2 wheeled) Transfers: Stand Pivot Transfers;Sit to/from Stand Sit to Stand: +2 physical assistance;From elevated surface;Mod assist Stand pivot transfers: Mod assist;Min assist;+2 physical assistance;+2 safety/equipment       General transfer comment: assist to power up and steady at RW from slightly elevated EOB; steadying assist to take small steps forward with RW, once nearing recliner pt leaving RW behind and use of UE support on armrest to turn body to sit, +2 physical assist (min-modA) provided throughout   Ambulation/Gait             General Gait Details: encouraged pt to ambulate further but she grabbed hold of chair  and insisted on sitting   Stairs             Wheelchair Mobility    Modified Rankin (Stroke Patients Only)       Balance Overall balance assessment: Needs assistance Sitting-balance support: Bilateral upper extremity supported;Feet supported Sitting balance-Leahy Scale: Poor Sitting balance - Comments: reliant on at least single UE support, at times minA for sitting balance. Worked on sitting more upright.    Standing balance support: Bilateral upper extremity supported Standing balance-Leahy Scale: Poor Standing balance comment: reliant on UE support and external assist                            Cognition Arousal/Alertness: Awake/alert Behavior During Therapy: WFL for tasks assessed/performed Overall Cognitive Status: History of cognitive impairments - at baseline Area of Impairment: Memory;Problem solving                     Memory: Decreased short-term memory       Problem Solving: Difficulty sequencing;Requires verbal cues;Requires tactile cues General Comments: increased time and cues to perform tasks      Exercises General Exercises - Lower Extremity Ankle Circles/Pumps: AROM;Both;10 reps;Supine Quad Sets: AROM;Both;10 reps;Supine Long Arc Quad: AROM;Both;10 reps;Seated    General Comments General comments (skin integrity, edema, etc.): sons present for session      Pertinent Vitals/Pain Pain Assessment: Faces Faces Pain Scale: Hurts even more Pain Location:  R side/ribs with mobility Pain Descriptors / Indicators: Guarding;Grimacing;Sore Pain Intervention(s): Limited activity within patient's tolerance;Monitored during session    Home Living                      Prior Function            PT Goals (current goals can now be found in the care plan section) Acute Rehab PT Goals Patient Stated Goal: decrease pain PT Goal Formulation: With patient/family Time For Goal Achievement: 03/01/18 Potential to Achieve Goals:  Good Progress towards PT goals: Progressing toward goals    Frequency    Min 3X/week      PT Plan Current plan remains appropriate    Co-evaluation PT/OT/SLP Co-Evaluation/Treatment: Yes Reason for Co-Treatment: Complexity of the patient's impairments (multi-system involvement);For patient/therapist safety PT goals addressed during session: Mobility/safety with mobility;Balance;Proper use of DME;Strengthening/ROM        AM-PAC PT "6 Clicks" Mobility   Outcome Measure  Help needed turning from your back to your side while in a flat bed without using bedrails?: A Lot Help needed moving from lying on your back to sitting on the side of a flat bed without using bedrails?: A Lot Help needed moving to and from a bed to a chair (including a wheelchair)?: A Lot Help needed standing up from a chair using your arms (e.g., wheelchair or bedside chair)?: A Lot Help needed to walk in hospital room?: A Lot Help needed climbing 3-5 steps with a railing? : Total 6 Click Score: 11    End of Session Equipment Utilized During Treatment: Oxygen;Gait belt;Other (comment)(gait belt high on chest ) Activity Tolerance: Patient limited by pain Patient left: in chair;with call bell/phone within reach;with chair alarm set;with family/visitor present Nurse Communication: Mobility status PT Visit Diagnosis: Other abnormalities of gait and mobility (R26.89);Pain;Difficulty in walking, not elsewhere classified (R26.2) Pain - Right/Left: Right Pain - part of body: (ribs)     Time: 1410-1435 PT Time Calculation (min) (ACUTE ONLY): 25 min  Charges:  $Gait Training: 8-22 mins $Therapeutic Activity: 8-22 mins                     Leighton Roach, [place]  Pager [phone] Office [place] 02/18/2018, 3:53 PM

## 2018-02-18 NOTE — Care Management Important Message (Signed)
Important Message  Patient Details  Name: Crystal Benson MRN: [id number] Date of Birth: 1920-10-10   Medicare Important Message Given:  Yes    Tipton Ballow P Graden Hoshino 02/18/2018, 4:53 PM

## 2018-02-18 NOTE — Discharge Summary (Signed)
Physician Discharge Summary  Patient ID: Crystal Benson MRN: [id number] DOB/AGE: 1920-09-26 84 y.o.  Admit date: 02/14/2018 Discharge date: 02/19/2018  Discharge Diagnoses Ground level fall  Right rib fractures (5-6, 9-10) with right pneumothorax HTN Chronic renal insufficiency Infrarenal AAA  Consultants None  Procedures 1. Chest tube insertion - 02/14/18 Dr. Davonna Belling  HPI: Patient is a 84 year old female who presented to [facility] after ground level fall with right chest pain. She was found to have some right sided rib fractures with a pneumothorax. Chest tube was placed by ED provider and patient was transferred to [facility] for admission to the trauma service.   Hospital Course: Patient developed an air leak at site of right chest tube which resolved with conservative management. Right chest tube was removed 11/25 and follow up CXR did not show any recurrent pneumothorax. Patient was evaluated by both PT and OT who both recommended SNF at discharge. Patient is a resident of [facility] in independent living and will be going to the SNF facility there.   On 02/19/18 patient was felt stable for discharge to SNF. Follow up is as outlined below.     Allergies as of 02/19/2018      Reactions   Shellfish Allergy Nausea Only      Medication List    TAKE these medications   carboxymethylcellulose 0.5 % Soln Commonly known as:  REFRESH PLUS Place 1 drop into both eyes 3 (three) times daily as needed (dry eyes).   Cholecalciferol 50 MCG (2000 UT) Tabs Take 2,000 Units by mouth daily.   docusate sodium 100 MG capsule Commonly known as:  COLACE Take 1 capsule (100 mg total) by mouth 2 (two) times daily.   losartan 100 MG tablet Commonly known as:  COZAAR Take 100 mg by mouth daily.   methocarbamol 500 MG tablet Commonly known as:  ROBAXIN Take 1 tablet (500 mg total) by mouth every 8 (eight) hours as needed for muscle spasms.   omeprazole 20 MG capsule Commonly  known as:  PRILOSEC Take 20 mg by mouth daily.   oxyCODONE 5 MG immediate release tablet Commonly known as:  Oxy IR/ROXICODONE Take 0.5-1 tablets (2.5-5 mg total) by mouth every 4 (four) hours as needed for severe pain.   polyethylene glycol packet Commonly known as:  MIRALAX / GLYCOLAX Take 17 g by mouth daily.   traMADol 50 MG tablet Commonly known as:  ULTRAM Take 50 mg by mouth every 6 (six) hours as needed for moderate pain.        Follow-up Information    [facility]. Go on 03/04/2018.   Why:  Follow up appointment scheduled for 10:20 AM. Please arrive 30 min prior to appointment time. Bring photo ID and insurance information. Go to [facility] for follow up chest x-ray the day prior to appointment.  Contact information: [place] [phone] [place]. Go on 03/03/2018.   Why:  Go for follow up chest x-ray the day prior to trauma clinic appointment. You can call to schedule or show up as a walk in.  Contact information: [place] [address] [phone]           Signed: Brigid Re , [facility] Surgery 02/19/2018, 4:26 PM Pager: [phone] Mon-Fri 7:00 am-4:30 pm Sat-Sun 7:00 am-11:30 am

## 2018-02-18 NOTE — Discharge Instructions (Signed)

## 2018-02-19 MED ORDER — OXYCODONE HCL 5 MG PO TABS: 2.5000 mg | ORAL_TABLET | ORAL | 0 refills | Status: DC | PRN

## 2018-02-19 MED ORDER — DOCUSATE SODIUM 100 MG PO CAPS: 100.0000 mg | ORAL_CAPSULE | Freq: Two times a day (BID) | ORAL | 0 refills | Status: DC

## 2018-02-19 MED ORDER — BISACODYL 10 MG RE SUPP
10.0000 mg | Freq: Once | RECTAL | Status: AC
  Administered 2018-02-19: 10 mg via RECTAL
  Filled 2018-02-19: qty 1

## 2018-02-19 MED ORDER — POLYETHYLENE GLYCOL 3350 17 G PO PACK: 17.0000 g | PACK | Freq: Every day | ORAL | 0 refills | Status: DC

## 2018-02-19 MED ORDER — METHOCARBAMOL 500 MG PO TABS: 500.0000 mg | ORAL_TABLET | Freq: Three times a day (TID) | ORAL | 0 refills | Status: DC | PRN

## 2018-02-19 NOTE — Progress Notes (Addendum)
Insurance authorization received by [employer]. Pt's son has been updated. Pt can be transported to [facility].   Number for Report: [phone]. [phone].   CSW called for [facility] transportation. Med Necessity form, pt's prescriptions, and DNR placed in pt's chart.   Wendelyn Breslow, [facility]  [phone]

## 2018-02-19 NOTE — Clinical Social Work Placement (Signed)
   CLINICAL SOCIAL WORK PLACEMENT  NOTE  Date:  02/19/2018  Patient Details  Name: Crystal Benson MRN: [id number] Date of Birth: 12/17/1920  Clinical Social Work is seeking post-discharge placement for this patient at the [place] level of care (*CSW will initial, date and re-position this form in  chart as items are completed):   Yes   Patient/family provided with [facility] Work Department's list of facilities offering this level of care within the geographic area requested by the patient (or if unable, by the patient's family).  Yes   Patient/family informed of their freedom to choose among providers that offer the needed level of care, that participate in Medicare, Medicaid or managed care program needed by the patient, have an available bed and are willing to accept the patient.  Yes   Patient/family informed of [facility]'s ownership interest in [facility] and [facility], as well as of the fact that they are under no obligation to receive care at these facilities.  PASRR submitted to EDS on 02/16/18     PASRR number received on 02/16/18     Existing PASRR number confirmed on       FL2 transmitted to all facilities in geographic area requested by pt/family on 02/16/18     FL2 transmitted to all facilities within larger geographic area on       Patient informed that his/her managed care company has contracts with or will negotiate with certain facilities, including the following:        Yes   Patient/family informed of bed offers received.  Patient chooses bed at [facility]     Physician recommends and patient chooses bed at      Patient to be transferred to [facility] on 02/19/18.  Patient to be transferred to facility by [facility]     Patient family notified on 02/19/18 of transfer.  Name of family member notified:  Pt's son Tom     PHYSICIAN       Additional Comment:    _______________________________________________ Wendelyn Breslow, LCSW 02/19/2018, 5:48 PM

## 2018-02-19 NOTE — Progress Notes (Signed)
[facility] Progress Note     Subjective: CC: pain Had some pain in hip that is chronic and some pain in right chest overnight, seems improved with medication. Not sure whether patient has had a BM but is not eating much at a time right now.  Family at bedside.   Objective: Vital signs in last 24 hours: Temp:  [97.5 F (36.4 C)-98.3 F (36.8 C)] 98.3 F (36.8 C) (11/27 0451) Pulse Rate:  [62-89] 62 (11/27 0451) Resp:  [14-18] 18 (11/27 0451) BP: (124-158)/(74-79) 158/74 (11/27 0451) SpO2:  [96 %-97 %] 96 % (11/27 0451) Last BM Date: (PTA)  Intake/Output from previous day: 11/26 0701 - 11/27 0700 In: 870 [P.O.:870] Out: 1150 [Urine:1150] Intake/Output this shift: No intake/output data recorded.  PE: Gen: Alert, NAD, pleasant Card: Regular rate and rhythm, pedal pulses 2+ BL Pulm: Normal effort, clear to auscultation bilaterally Abd: Soft, non-tender, non-distended, bowel sounds present Skin: warm and dry, no rashes  Psych: A&Ox3, somewhat forgetful but easily reoriented  Lab Results:  No results for input(s): WBC, HGB, HCT, PLT in the last 72 hours. BMET No results for input(s): NA, K, CL, CO2, GLUCOSE, BUN, CREATININE, CALCIUM in the last 72 hours. PT/INR No results for input(s): LABPROT, INR in the last 72 hours. CMP     Component Value Date/Time   NA 137 02/16/2018 0337   K 3.8 02/16/2018 0337   CL 106 02/16/2018 0337   CO2 23 02/16/2018 0337   GLUCOSE 128 (H) 02/16/2018 0337   BUN 23 02/16/2018 0337   CREATININE 1.24 (H) 02/16/2018 0337   CALCIUM 9.8 02/16/2018 0337   PROT 6.8 02/14/2018 1909   ALBUMIN 3.7 02/14/2018 1909   AST 21 02/14/2018 1909   ALT 12 02/14/2018 1909   ALKPHOS 77 02/14/2018 1909   BILITOT 0.4 02/14/2018 1909   GFRNONAA 36 (L) 02/16/2018 0337   GFRAA 41 (L) 02/16/2018 0337   Lipase  No results found for: LIPASE     Studies/Results: Dg Chest Port 1 View  Result Date: 02/17/2018 CLINICAL DATA:  Right-sided  chest pain following chest tube removed EXAM: PORTABLE CHEST 1 VIEW COMPARISON:  Film from earlier in the same day. FINDINGS: Cardiac shadow is stable in appearance. Aortic calcifications are noted. Recently seen pigtail catheter has been removed. Stable basilar atelectasis on the right is noted. No pneumothorax is seen. Rib fractures on the right are again noted. IMPRESSION: No pneumothorax following chest tube removal. Otherwise stable appearance of the chest. Electronically Signed   By: Inez Catalina M.D.   On: 02/17/2018 14:23    Anti-infectives: Anti-infectives (From admission, onward)   None       Assessment/Plan Fall Rib fx(5,6,9,10)/ptx-R chest tube removed, repeat CXR without PTX -pulm toilet and pain control. -PT/OT HTN- cozaar restarted Infrarenal AAA- 5x5cm with mural clot noted, does not appear acute, outpatient follow up CRI- cr 1.24 11/24  FEN- HH diet VTE- SCDs/Lovenox ID -none  Dispo- stable for discharge to SNF when bed available   LOS: 4 days    Brigid Re , [facility] Surgery 02/19/2018, 10:42 AM Pager: [phone] Mon-Fri 7:00 am-4:30 pm Sat-Sun 7:00 am-11:30 am

## 2018-02-19 NOTE — Progress Notes (Signed)
Physical Therapy Treatment Patient Details Name: Crystal Benson MRN: [id number] DOB: [birth year] Today's Date: 02/19/2018    History of Present Illness Pt is a 84 y.o. female who fell [facility]) sustaining R rib fxs and R pneumothorax.     PT Comments    Pt progressing slowly towards goals. Max encouragement for mobility to chair this session. Required mod A +2 with use of RW for mobility to chair. Pt complaining of increased pain, so further mobility deferred. Feel current recommendations appropriate as pt currently require increased assist. Will continue to follow acutely to maximize functional mobility independence and safety.   Follow Up Recommendations  SNF     Equipment Recommendations  None recommended by PT    Recommendations for Other Services       Precautions / Restrictions Precautions Precautions: Fall Restrictions Weight Bearing Restrictions: No    Mobility  Bed Mobility Overal bed mobility: Needs Assistance Bed Mobility: Supine to Sit     Supine to sit: Mod assist;+2 for physical assistance;HOB elevated     General bed mobility comments: Pt requesting to perform as much as she can this session. Required assist for scooting hips to EOB and trunk elevation.   Transfers Overall transfer level: Needs assistance Equipment used: Rolling walker (2 wheeled) Transfers: Sit to/from [facility] Sit to Stand: Mod assist;+2 physical assistance Stand pivot transfers: Mod assist;Min assist;+2 physical assistance       General transfer comment: Mod A +2 for lift assist and steadying assist. Pt reaching for recliner and leaving RW behind during transfer to chair, and would not use RW despite safety cues. Required min to mod A throughout for steadying.   Ambulation/Gait                 Stairs             Wheelchair Mobility    Modified Rankin (Stroke Patients Only)       Balance Overall balance assessment: Needs  assistance Sitting-balance support: Bilateral upper extremity supported;Feet supported Sitting balance-Leahy Scale: Poor     Standing balance support: Bilateral upper extremity supported Standing balance-Leahy Scale: Poor Standing balance comment: reliant on UE support and external assist                            Cognition Arousal/Alertness: Awake/alert Behavior During Therapy: WFL for tasks assessed/performed Overall Cognitive Status: History of cognitive impairments - at baseline Area of Impairment: Memory;Problem solving                     Memory: Decreased short-term memory       Problem Solving: Difficulty sequencing;Requires verbal cues;Requires tactile cues General Comments: increased time and cues to perform tasks      Exercises      General Comments        Pertinent Vitals/Pain Pain Assessment: Faces Faces Pain Scale: Hurts even more Pain Location: R side/ribs with mobility Pain Descriptors / Indicators: Guarding;Grimacing;Sore Pain Intervention(s): Limited activity within patient's tolerance;Monitored during session;Repositioned    Home Living                      Prior Function            PT Goals (current goals can now be found in the care plan section) Acute Rehab PT Goals Patient Stated Goal: decrease pain PT Goal Formulation: With patient/family Time For Goal Achievement:  03/01/18 Potential to Achieve Goals: Good Progress towards PT goals: Progressing toward goals    Frequency    Min 3X/week      PT Plan Current plan remains appropriate    Co-evaluation              AM-PAC PT "6 Clicks" Mobility   Outcome Measure  Help needed turning from your back to your side while in a flat bed without using bedrails?: A Lot Help needed moving from lying on your back to sitting on the side of a flat bed without using bedrails?: A Lot Help needed moving to and from a bed to a chair (including a wheelchair)?: A  Lot Help needed standing up from a chair using your arms (e.g., wheelchair or bedside chair)?: A Lot Help needed to walk in hospital room?: A Lot Help needed climbing 3-5 steps with a railing? : Total 6 Click Score: 11    End of Session   Activity Tolerance: Patient limited by pain Patient left: in chair;with call bell/phone within reach;with chair alarm set;with family/visitor present Nurse Communication: Mobility status PT Visit Diagnosis: Other abnormalities of gait and mobility (R26.89);Pain;Difficulty in walking, not elsewhere classified (R26.2) Pain - Right/Left: Right Pain - part of body: (ribs)     Time: 4695-0722 PT Time Calculation (min) (ACUTE ONLY): 15 min  Charges:  $Therapeutic Activity: 8-22 mins                     Leighton Ruff, PT, DPT  Acute Rehabilitation Services  Pager: [phone] Office: [phone]    Rudean Hitt 02/19/2018, 2:32 PM

## 2018-02-23 DIAGNOSIS — I714 Abdominal aortic aneurysm, without rupture: Secondary | ICD-10-CM | POA: Diagnosis not present

## 2018-02-23 DIAGNOSIS — I10 Essential (primary) hypertension: Secondary | ICD-10-CM | POA: Diagnosis not present

## 2018-02-23 DIAGNOSIS — S2241XD Multiple fractures of ribs, right side, subsequent encounter for fracture with routine healing: Secondary | ICD-10-CM | POA: Diagnosis not present

## 2018-02-23 DIAGNOSIS — R262 Difficulty in walking, not elsewhere classified: Secondary | ICD-10-CM | POA: Diagnosis not present

## 2018-02-23 DIAGNOSIS — Z9181 History of falling: Secondary | ICD-10-CM | POA: Diagnosis not present

## 2018-02-23 DIAGNOSIS — F0390 Unspecified dementia without behavioral disturbance: Secondary | ICD-10-CM | POA: Diagnosis not present

## 2018-02-23 DIAGNOSIS — R2681 Unsteadiness on feet: Secondary | ICD-10-CM | POA: Diagnosis not present

## 2018-02-23 DIAGNOSIS — I129 Hypertensive chronic kidney disease with stage 1 through stage 4 chronic kidney disease, or unspecified chronic kidney disease: Secondary | ICD-10-CM | POA: Diagnosis not present

## 2018-02-23 DIAGNOSIS — M6281 Muscle weakness (generalized): Secondary | ICD-10-CM | POA: Diagnosis not present

## 2018-02-23 DIAGNOSIS — N183 Chronic kidney disease, stage 3 (moderate): Secondary | ICD-10-CM | POA: Diagnosis not present

## 2018-02-25 DIAGNOSIS — S2241XD Multiple fractures of ribs, right side, subsequent encounter for fracture with routine healing: Secondary | ICD-10-CM | POA: Diagnosis not present

## 2018-02-25 DIAGNOSIS — F0390 Unspecified dementia without behavioral disturbance: Secondary | ICD-10-CM | POA: Diagnosis not present

## 2018-02-25 DIAGNOSIS — Z9181 History of falling: Secondary | ICD-10-CM | POA: Diagnosis not present

## 2018-02-25 DIAGNOSIS — I10 Essential (primary) hypertension: Secondary | ICD-10-CM | POA: Diagnosis not present

## 2018-03-03 ENCOUNTER — Ambulatory Visit
Admission: RE | Admit: 2018-03-03 | Discharge: 2018-03-03 | Disposition: A | Discharge status: Home / Self Care | Payer: Medicare HMO | Source: Ambulatory Visit | Attending: Physician Assistant | Admitting: Physician Assistant

## 2018-03-03 ENCOUNTER — Other Ambulatory Visit: Payer: Self-pay | Admitting: Physician Assistant

## 2018-03-03 DIAGNOSIS — J939 Pneumothorax, unspecified: Secondary | ICD-10-CM

## 2018-04-01 DIAGNOSIS — F0390 Unspecified dementia without behavioral disturbance: Secondary | ICD-10-CM | POA: Diagnosis not present

## 2018-04-01 DIAGNOSIS — Z9181 History of falling: Secondary | ICD-10-CM | POA: Diagnosis not present

## 2018-04-01 DIAGNOSIS — S2241XD Multiple fractures of ribs, right side, subsequent encounter for fracture with routine healing: Secondary | ICD-10-CM | POA: Diagnosis not present

## 2018-04-01 DIAGNOSIS — I10 Essential (primary) hypertension: Secondary | ICD-10-CM | POA: Diagnosis not present

## 2018-04-29 DIAGNOSIS — H5201 Hypermetropia, right eye: Secondary | ICD-10-CM | POA: Diagnosis not present

## 2018-04-29 DIAGNOSIS — H5212 Myopia, left eye: Secondary | ICD-10-CM | POA: Diagnosis not present

## 2018-04-29 DIAGNOSIS — Z961 Presence of intraocular lens: Secondary | ICD-10-CM | POA: Diagnosis not present

## 2018-04-29 DIAGNOSIS — H04123 Dry eye syndrome of bilateral lacrimal glands: Secondary | ICD-10-CM | POA: Diagnosis not present

## 2018-04-29 DIAGNOSIS — H16223 Keratoconjunctivitis sicca, not specified as Sjogren's, bilateral: Secondary | ICD-10-CM | POA: Diagnosis not present

## 2018-04-29 DIAGNOSIS — H0100B Unspecified blepharitis left eye, upper and lower eyelids: Secondary | ICD-10-CM | POA: Diagnosis not present

## 2018-04-29 DIAGNOSIS — H02831 Dermatochalasis of right upper eyelid: Secondary | ICD-10-CM | POA: Diagnosis not present

## 2018-04-29 DIAGNOSIS — H353131 Nonexudative age-related macular degeneration, bilateral, early dry stage: Secondary | ICD-10-CM | POA: Diagnosis not present

## 2018-04-29 DIAGNOSIS — H02834 Dermatochalasis of left upper eyelid: Secondary | ICD-10-CM | POA: Diagnosis not present

## 2018-04-29 DIAGNOSIS — H0259 Other disorders affecting eyelid function: Secondary | ICD-10-CM | POA: Diagnosis not present

## 2018-04-29 DIAGNOSIS — H524 Presbyopia: Secondary | ICD-10-CM | POA: Diagnosis not present

## 2018-04-29 DIAGNOSIS — H52203 Unspecified astigmatism, bilateral: Secondary | ICD-10-CM | POA: Diagnosis not present

## 2018-04-29 DIAGNOSIS — H43813 Vitreous degeneration, bilateral: Secondary | ICD-10-CM | POA: Diagnosis not present

## 2018-04-29 DIAGNOSIS — H02132 Senile ectropion of right lower eyelid: Secondary | ICD-10-CM | POA: Diagnosis not present

## 2018-04-29 DIAGNOSIS — H0100A Unspecified blepharitis right eye, upper and lower eyelids: Secondary | ICD-10-CM | POA: Diagnosis not present

## 2018-05-20 DIAGNOSIS — Z9181 History of falling: Secondary | ICD-10-CM | POA: Diagnosis not present

## 2018-05-20 DIAGNOSIS — M6281 Muscle weakness (generalized): Secondary | ICD-10-CM | POA: Diagnosis not present

## 2018-05-26 DIAGNOSIS — Z9181 History of falling: Secondary | ICD-10-CM | POA: Diagnosis not present

## 2018-05-26 DIAGNOSIS — M6281 Muscle weakness (generalized): Secondary | ICD-10-CM | POA: Diagnosis not present

## 2018-06-16 DIAGNOSIS — F0280 Dementia in other diseases classified elsewhere without behavioral disturbance: Secondary | ICD-10-CM | POA: Diagnosis not present

## 2018-06-16 DIAGNOSIS — G301 Alzheimer's disease with late onset: Secondary | ICD-10-CM | POA: Diagnosis not present

## 2018-06-16 DIAGNOSIS — I10 Essential (primary) hypertension: Secondary | ICD-10-CM | POA: Diagnosis not present

## 2018-06-16 DIAGNOSIS — K219 Gastro-esophageal reflux disease without esophagitis: Secondary | ICD-10-CM | POA: Diagnosis not present

## 2018-06-16 DIAGNOSIS — W19XXXS Unspecified fall, sequela: Secondary | ICD-10-CM | POA: Diagnosis not present

## 2018-07-15 DIAGNOSIS — L989 Disorder of the skin and subcutaneous tissue, unspecified: Secondary | ICD-10-CM | POA: Diagnosis not present

## 2018-07-16 DIAGNOSIS — C4490 Unspecified malignant neoplasm of skin, unspecified: Secondary | ICD-10-CM | POA: Diagnosis not present

## 2018-07-22 DIAGNOSIS — N183 Chronic kidney disease, stage 3 (moderate): Secondary | ICD-10-CM | POA: Diagnosis not present

## 2018-07-22 DIAGNOSIS — M6281 Muscle weakness (generalized): Secondary | ICD-10-CM | POA: Diagnosis not present

## 2018-07-22 DIAGNOSIS — Z9181 History of falling: Secondary | ICD-10-CM | POA: Diagnosis not present

## 2018-07-22 DIAGNOSIS — I129 Hypertensive chronic kidney disease with stage 1 through stage 4 chronic kidney disease, or unspecified chronic kidney disease: Secondary | ICD-10-CM | POA: Diagnosis not present

## 2018-08-14 DIAGNOSIS — R197 Diarrhea, unspecified: Secondary | ICD-10-CM | POA: Diagnosis not present

## 2018-08-14 DIAGNOSIS — G4700 Insomnia, unspecified: Secondary | ICD-10-CM | POA: Diagnosis not present

## 2018-08-14 DIAGNOSIS — I10 Essential (primary) hypertension: Secondary | ICD-10-CM | POA: Diagnosis not present

## 2018-08-14 DIAGNOSIS — K219 Gastro-esophageal reflux disease without esophagitis: Secondary | ICD-10-CM | POA: Diagnosis not present

## 2018-08-26 DIAGNOSIS — L03221 Cellulitis of neck: Secondary | ICD-10-CM | POA: Diagnosis not present

## 2018-08-26 DIAGNOSIS — L989 Disorder of the skin and subcutaneous tissue, unspecified: Secondary | ICD-10-CM | POA: Diagnosis not present

## 2018-08-28 DIAGNOSIS — C4442 Squamous cell carcinoma of skin of scalp and neck: Secondary | ICD-10-CM | POA: Diagnosis not present

## 2018-08-28 DIAGNOSIS — C760 Malignant neoplasm of head, face and neck: Secondary | ICD-10-CM | POA: Diagnosis not present

## 2018-10-23 DIAGNOSIS — F0280 Dementia in other diseases classified elsewhere without behavioral disturbance: Secondary | ICD-10-CM | POA: Diagnosis not present

## 2018-10-23 DIAGNOSIS — N183 Chronic kidney disease, stage 3 (moderate): Secondary | ICD-10-CM | POA: Diagnosis not present

## 2018-10-23 DIAGNOSIS — I129 Hypertensive chronic kidney disease with stage 1 through stage 4 chronic kidney disease, or unspecified chronic kidney disease: Secondary | ICD-10-CM | POA: Diagnosis not present

## 2018-10-23 DIAGNOSIS — G301 Alzheimer's disease with late onset: Secondary | ICD-10-CM | POA: Diagnosis not present

## 2018-10-23 DIAGNOSIS — F5101 Primary insomnia: Secondary | ICD-10-CM | POA: Diagnosis not present

## 2018-10-27 DIAGNOSIS — R05 Cough: Secondary | ICD-10-CM | POA: Diagnosis not present

## 2018-10-27 DIAGNOSIS — R062 Wheezing: Secondary | ICD-10-CM | POA: Diagnosis not present

## 2018-10-27 DIAGNOSIS — G301 Alzheimer's disease with late onset: Secondary | ICD-10-CM | POA: Diagnosis not present

## 2018-10-27 DIAGNOSIS — I10 Essential (primary) hypertension: Secondary | ICD-10-CM | POA: Diagnosis not present

## 2018-10-27 DIAGNOSIS — F0281 Dementia in other diseases classified elsewhere with behavioral disturbance: Secondary | ICD-10-CM | POA: Diagnosis not present

## 2018-12-21 DIAGNOSIS — F0390 Unspecified dementia without behavioral disturbance: Secondary | ICD-10-CM | POA: Diagnosis not present

## 2018-12-21 DIAGNOSIS — I714 Abdominal aortic aneurysm, without rupture: Secondary | ICD-10-CM | POA: Diagnosis not present

## 2018-12-21 DIAGNOSIS — N183 Chronic kidney disease, stage 3 (moderate): Secondary | ICD-10-CM | POA: Diagnosis not present

## 2018-12-21 DIAGNOSIS — K219 Gastro-esophageal reflux disease without esophagitis: Secondary | ICD-10-CM | POA: Diagnosis not present

## 2018-12-21 DIAGNOSIS — I129 Hypertensive chronic kidney disease with stage 1 through stage 4 chronic kidney disease, or unspecified chronic kidney disease: Secondary | ICD-10-CM | POA: Diagnosis not present

## 2019-01-21 DIAGNOSIS — H903 Sensorineural hearing loss, bilateral: Secondary | ICD-10-CM | POA: Diagnosis not present

## 2019-02-14 DIAGNOSIS — N183 Chronic kidney disease, stage 3 unspecified: Secondary | ICD-10-CM | POA: Diagnosis not present

## 2019-02-14 DIAGNOSIS — F0390 Unspecified dementia without behavioral disturbance: Secondary | ICD-10-CM | POA: Diagnosis not present

## 2019-02-14 DIAGNOSIS — Z9181 History of falling: Secondary | ICD-10-CM | POA: Diagnosis not present

## 2019-02-14 DIAGNOSIS — K219 Gastro-esophageal reflux disease without esophagitis: Secondary | ICD-10-CM | POA: Diagnosis not present

## 2019-02-14 DIAGNOSIS — I129 Hypertensive chronic kidney disease with stage 1 through stage 4 chronic kidney disease, or unspecified chronic kidney disease: Secondary | ICD-10-CM | POA: Diagnosis not present

## 2019-02-14 DIAGNOSIS — G4700 Insomnia, unspecified: Secondary | ICD-10-CM | POA: Diagnosis not present

## 2019-02-16 DIAGNOSIS — H903 Sensorineural hearing loss, bilateral: Secondary | ICD-10-CM | POA: Diagnosis not present

## 2019-02-23 DIAGNOSIS — I129 Hypertensive chronic kidney disease with stage 1 through stage 4 chronic kidney disease, or unspecified chronic kidney disease: Secondary | ICD-10-CM | POA: Diagnosis not present

## 2019-02-23 DIAGNOSIS — N183 Chronic kidney disease, stage 3 unspecified: Secondary | ICD-10-CM | POA: Diagnosis not present

## 2019-02-23 DIAGNOSIS — D513 Other dietary vitamin B12 deficiency anemia: Secondary | ICD-10-CM | POA: Diagnosis not present

## 2019-03-30 DIAGNOSIS — D513 Other dietary vitamin B12 deficiency anemia: Secondary | ICD-10-CM | POA: Diagnosis not present

## 2019-04-17 DIAGNOSIS — F0390 Unspecified dementia without behavioral disturbance: Secondary | ICD-10-CM | POA: Diagnosis not present

## 2019-04-17 DIAGNOSIS — N183 Chronic kidney disease, stage 3 unspecified: Secondary | ICD-10-CM | POA: Diagnosis not present

## 2019-04-17 DIAGNOSIS — K219 Gastro-esophageal reflux disease without esophagitis: Secondary | ICD-10-CM | POA: Diagnosis not present

## 2019-04-17 DIAGNOSIS — I129 Hypertensive chronic kidney disease with stage 1 through stage 4 chronic kidney disease, or unspecified chronic kidney disease: Secondary | ICD-10-CM | POA: Diagnosis not present

## 2019-06-06 DIAGNOSIS — E538 Deficiency of other specified B group vitamins: Secondary | ICD-10-CM | POA: Diagnosis not present

## 2019-06-06 DIAGNOSIS — F0390 Unspecified dementia without behavioral disturbance: Secondary | ICD-10-CM | POA: Diagnosis not present

## 2019-06-06 DIAGNOSIS — N1832 Chronic kidney disease, stage 3b: Secondary | ICD-10-CM | POA: Diagnosis not present

## 2019-06-06 DIAGNOSIS — I129 Hypertensive chronic kidney disease with stage 1 through stage 4 chronic kidney disease, or unspecified chronic kidney disease: Secondary | ICD-10-CM | POA: Diagnosis not present

## 2019-08-13 DIAGNOSIS — I129 Hypertensive chronic kidney disease with stage 1 through stage 4 chronic kidney disease, or unspecified chronic kidney disease: Secondary | ICD-10-CM | POA: Diagnosis not present

## 2019-08-13 DIAGNOSIS — F0390 Unspecified dementia without behavioral disturbance: Secondary | ICD-10-CM | POA: Diagnosis not present

## 2019-08-13 DIAGNOSIS — K219 Gastro-esophageal reflux disease without esophagitis: Secondary | ICD-10-CM | POA: Diagnosis not present

## 2019-08-13 DIAGNOSIS — N183 Chronic kidney disease, stage 3 unspecified: Secondary | ICD-10-CM | POA: Diagnosis not present

## 2019-08-25 DIAGNOSIS — E639 Nutritional deficiency, unspecified: Secondary | ICD-10-CM | POA: Diagnosis not present

## 2019-08-25 DIAGNOSIS — D513 Other dietary vitamin B12 deficiency anemia: Secondary | ICD-10-CM | POA: Diagnosis not present

## 2019-08-25 DIAGNOSIS — I129 Hypertensive chronic kidney disease with stage 1 through stage 4 chronic kidney disease, or unspecified chronic kidney disease: Secondary | ICD-10-CM | POA: Diagnosis not present

## 2019-10-24 DIAGNOSIS — G4700 Insomnia, unspecified: Secondary | ICD-10-CM | POA: Diagnosis not present

## 2019-10-24 DIAGNOSIS — N1832 Chronic kidney disease, stage 3b: Secondary | ICD-10-CM | POA: Diagnosis not present

## 2019-10-24 DIAGNOSIS — K219 Gastro-esophageal reflux disease without esophagitis: Secondary | ICD-10-CM | POA: Diagnosis not present

## 2019-10-24 DIAGNOSIS — F0390 Unspecified dementia without behavioral disturbance: Secondary | ICD-10-CM | POA: Diagnosis not present

## 2019-10-24 DIAGNOSIS — E538 Deficiency of other specified B group vitamins: Secondary | ICD-10-CM | POA: Diagnosis not present

## 2019-10-24 DIAGNOSIS — I129 Hypertensive chronic kidney disease with stage 1 through stage 4 chronic kidney disease, or unspecified chronic kidney disease: Secondary | ICD-10-CM | POA: Diagnosis not present

## 2019-10-24 DIAGNOSIS — Z9181 History of falling: Secondary | ICD-10-CM | POA: Diagnosis not present

## 2019-10-24 DIAGNOSIS — K047 Periapical abscess without sinus: Secondary | ICD-10-CM | POA: Diagnosis not present

## 2019-11-05 IMAGING — CT CT ABD-PELV W/ CM
1 series · 14 of 32 positions shown, 17 images · IV contrast (iopamidol)
Comparison: Chest x-ray from earlier same day.

CLINICAL DATA: Fall, RIGHT-sided rib pain. History of hypertension,
dementia.

EXAM:
CT CHEST, ABDOMEN, AND PELVIS WITH CONTRAST
TECHNIQUE: Multidetector CT imaging of the chest, abdomen and pelvis was
performed following the standard protocol during bolus
administration of intravenous contrast.
CONTRAST:  100mL XWTGPU-5NN IOPAMIDOL (XWTGPU-5NN) INJECTION 61%

[Series 2: delay · axial · delayed · 0.77mm/px · z∈[-764,-608]mm · 14 of 36 slices shown, 17 images]
[im 3/36  soft-tissue]
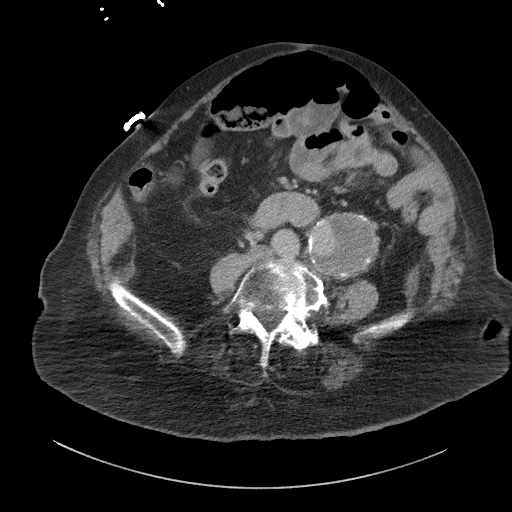
[im 3/36  bone]
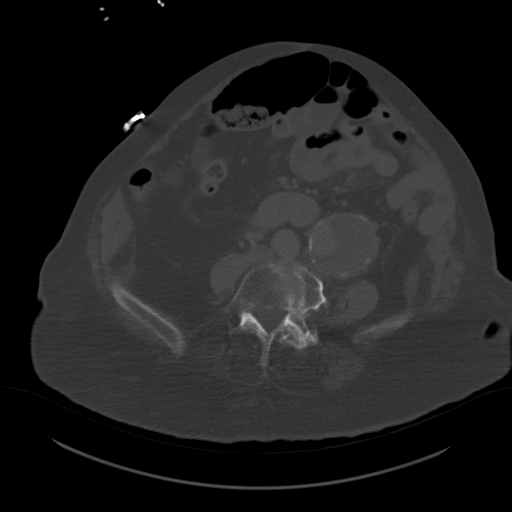
[im 6/36  soft-tissue]
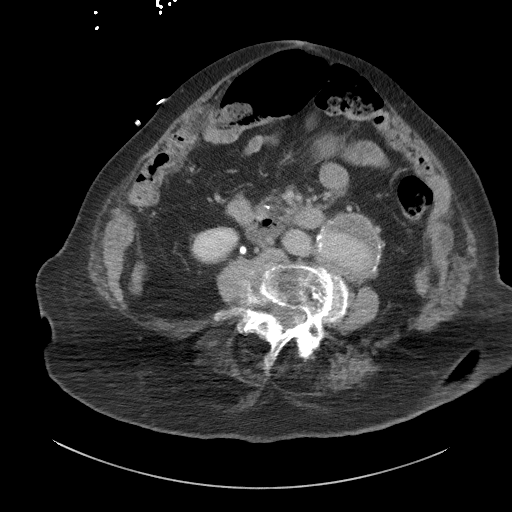
[im 8/36  soft-tissue]
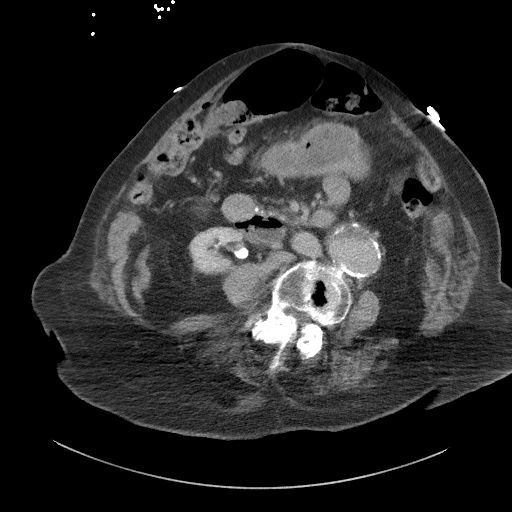
[im 12/36  soft-tissue]
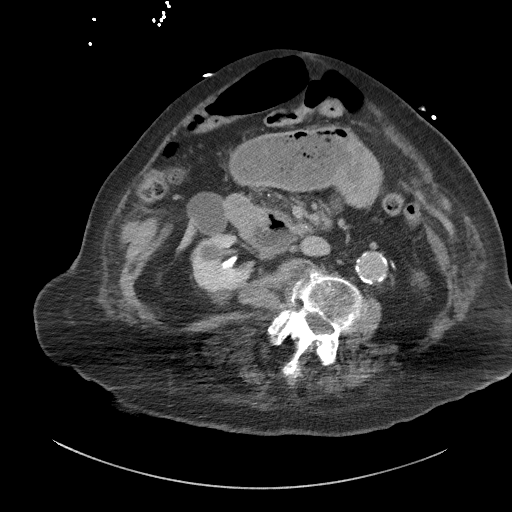
[im 15/36  soft-tissue]
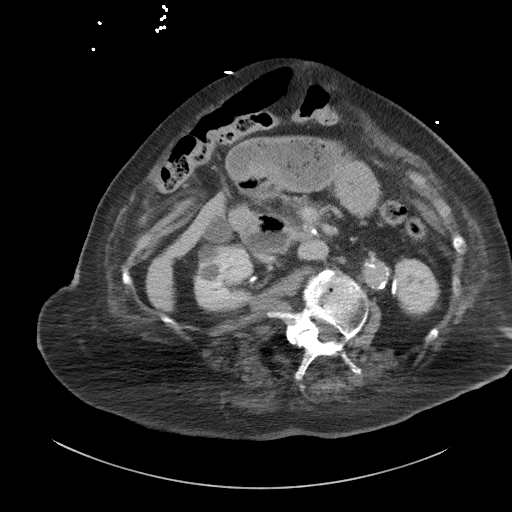
[im 19/36  soft-tissue]
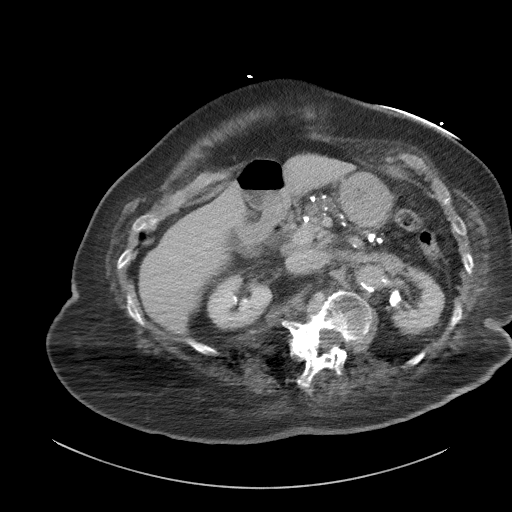
[im 21/36  soft-tissue]
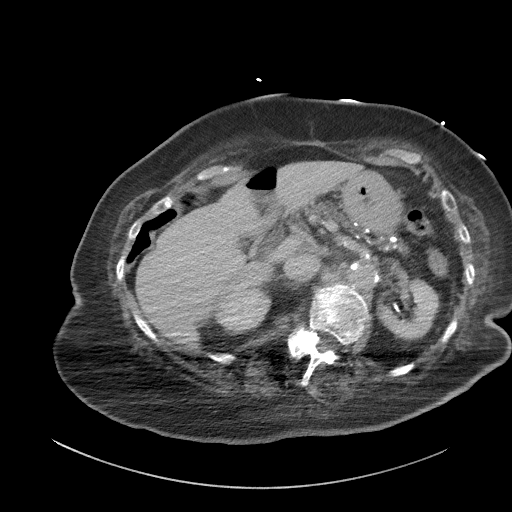
[im 24/36  soft-tissue]
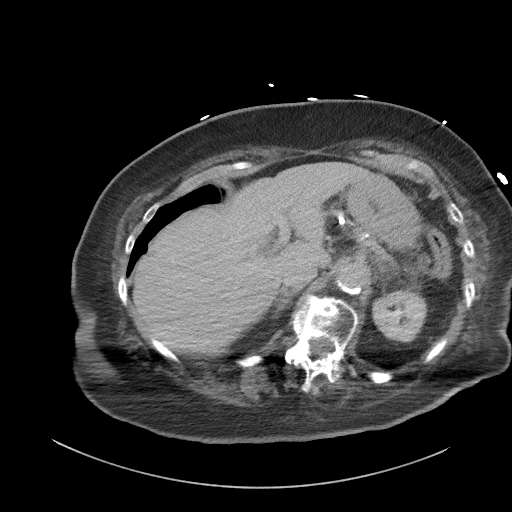
[im 28/36  soft-tissue]
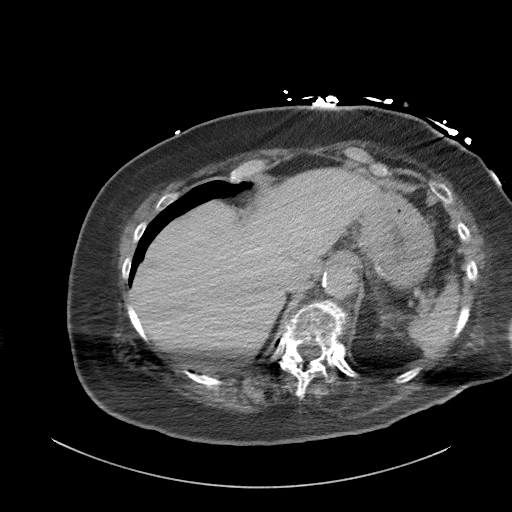
[im 28/36  bone]
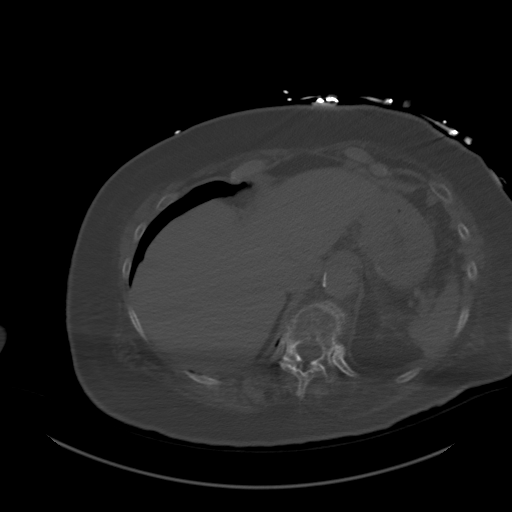
[im 30/36  soft-tissue]
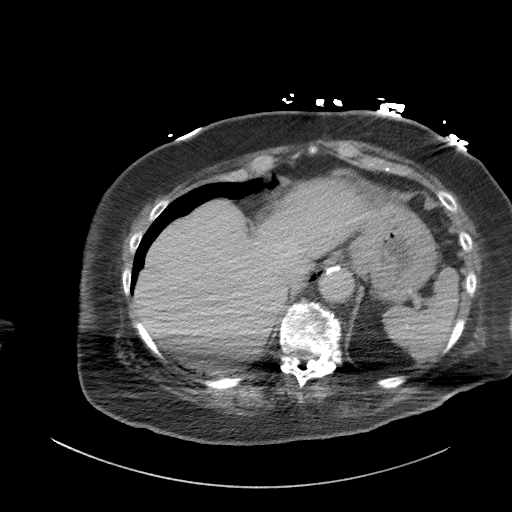
[im 31/36  lung]
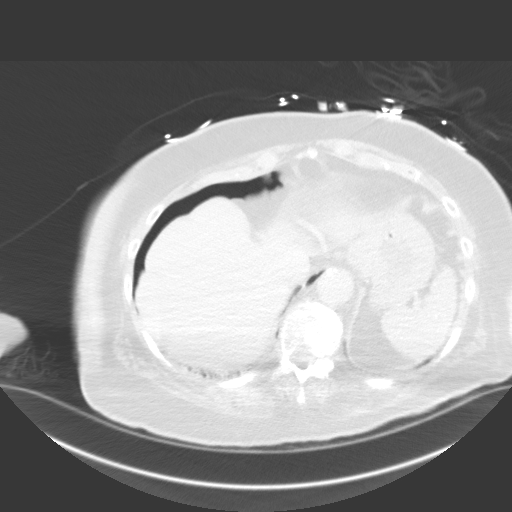
[im 32/36  lung]
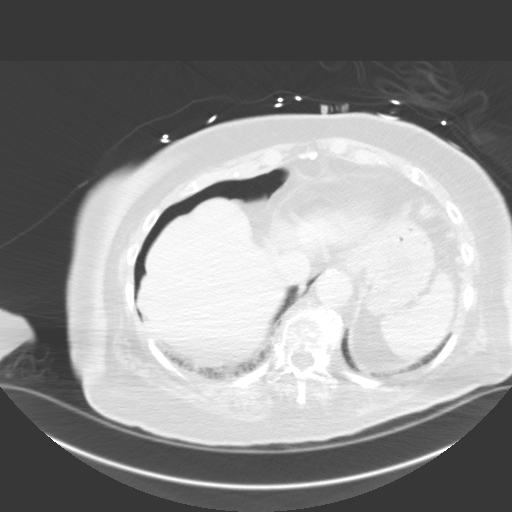
[im 33/36  soft-tissue]
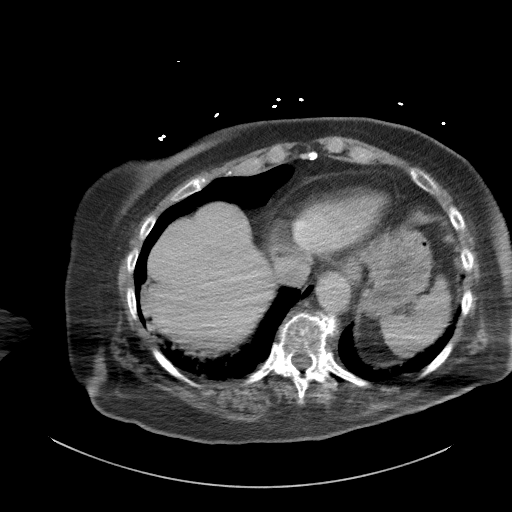
[im 33/36  lung]
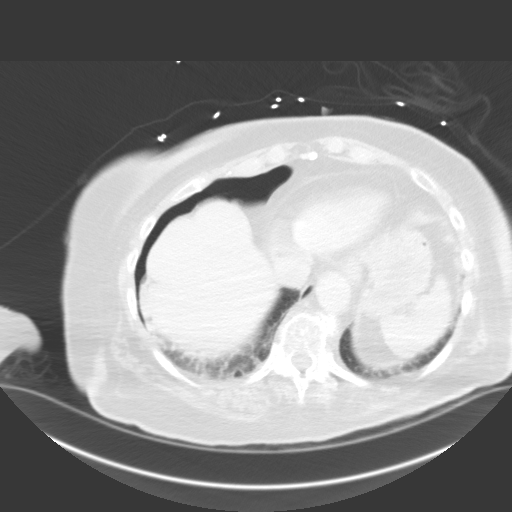
[im 34/36  lung]
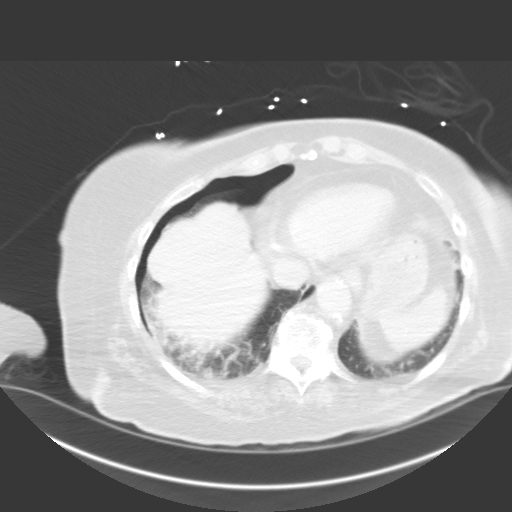

[14 of 32 positions shown; findings below may reference images not displayed]

FINDINGS: CT CHEST FINDINGS

Cardiovascular: No thoracic aortic aneurysm or evidence of aortic
dissection. Scattered aortic atherosclerosis. Borderline
cardiomegaly. No pericardial effusion.

Mediastinum/Nodes: No hemorrhage or edema appreciated within the
mediastinum. No mass or enlarged lymph nodes seen. Esophagus is
unremarkable. Trachea and central bronchi are unremarkable.

Lungs/Pleura: Pneumothorax, small to moderate in size, now most
prominently seen at the RIGHT lung base with patient in supine
positioning.

Lung contusion and/or atelectasis at the RIGHT lung base. No pleural
effusion or hemothorax seen. Mild scarring/atelectasis at the LEFT
lung base.

Musculoskeletal: Slightly displaced fractures of the RIGHT lateral
fifth and sixth ribs. Additional slightly displaced fractures of the
posterolateral RIGHT ninth and tenth ribs.

Additional slight deformities of the posterior RIGHT ninth and tenth
ribs, of indeterminate age.

Mild compression fracture deformity of the T7 vertebral body, of
uncertain age. No associated vertebral body retropulsion.

CT ABDOMEN PELVIS FINDINGS

Hepatobiliary: No hepatic injury or perihepatic hematoma.
Gallbladder is unremarkable

Pancreas: Calcifications throughout the pancreas, indicating chronic
pancreatitis. No acute findings.

Spleen: No splenic injury or perisplenic hematoma.

Adrenals/Urinary Tract: No adrenal hemorrhage or renal injury
identified. Bladder is unremarkable.

Stomach/Bowel: No dilated large or small bowel loops. No evidence of
bowel wall injury or bowel wall inflammation. Stomach is
unremarkable.

Vascular/Lymphatic: Saccular aneurysm of the infrarenal abdominal
aorta, measuring 5 x 5 cm, with large amount of associated mural
thrombus. No acute appearing vascular abnormality. No evidence of
aortic injury. No periaortic hemorrhage or edema. Extensive aortic
atherosclerosis.

No enlarged lymph nodes appreciated within the abdomen or pelvis.

Reproductive: Uterus and bilateral adnexa are unremarkable.

Other: No free fluid or hemorrhage within the abdomen or pelvis. No
free intraperitoneal air.

Musculoskeletal: Degenerative spondylosis throughout the scoliotic
lumbar spine, at least moderate in degree. Old healed fracture of
the RIGHT inferior pubic ramus. No acute appearing osseous
abnormality.
IMPRESSION: 1. Slightly displaced fractures of the RIGHT lateral fifth and sixth
ribs. Additional acute slightly displaced fractures of the
posterolateral RIGHT ninth and tenth ribs. Additional deformities of
the posterior RIGHT ninth and tenth ribs are of uncertain age.
2. RIGHT-sided pneumothorax, moderate in size, now most prominently
seen at the RIGHT lung base with patient in a supine position.
3. Lung contusion and/or atelectasis at the RIGHT lung base. No
pleural effusion or hemothorax seen.
4. Mild compression fracture deformity of the T7 vertebral body, of
uncertain age but favored to be chronic. No associated vertebral
body retropulsion.
5. No acute intra-abdominal or intrapelvic abnormality. No evidence
of solid organ injury. No free fluid or hemorrhage within the
abdomen or pelvis. No evidence of bowel injury.
6. Saccular aneurysm of the infrarenal abdominal aorta, measuring 5
x 5 cm, with large amount of associated mural thrombus. No acute
appearing vascular abnormality. No periaortic hemorrhage or edema.
7. Additional chronic/incidental findings detailed above.

Aortic Atherosclerosis ([id number]).

## 2019-11-08 IMAGING — DX DG CHEST 1V PORT
1 series · 1 of 1 positions shown · non-contrast
Comparison: Film from earlier in the same day.

CLINICAL DATA: Right-sided chest pain following chest tube removed

EXAM:
PORTABLE CHEST 1 VIEW

[chest]
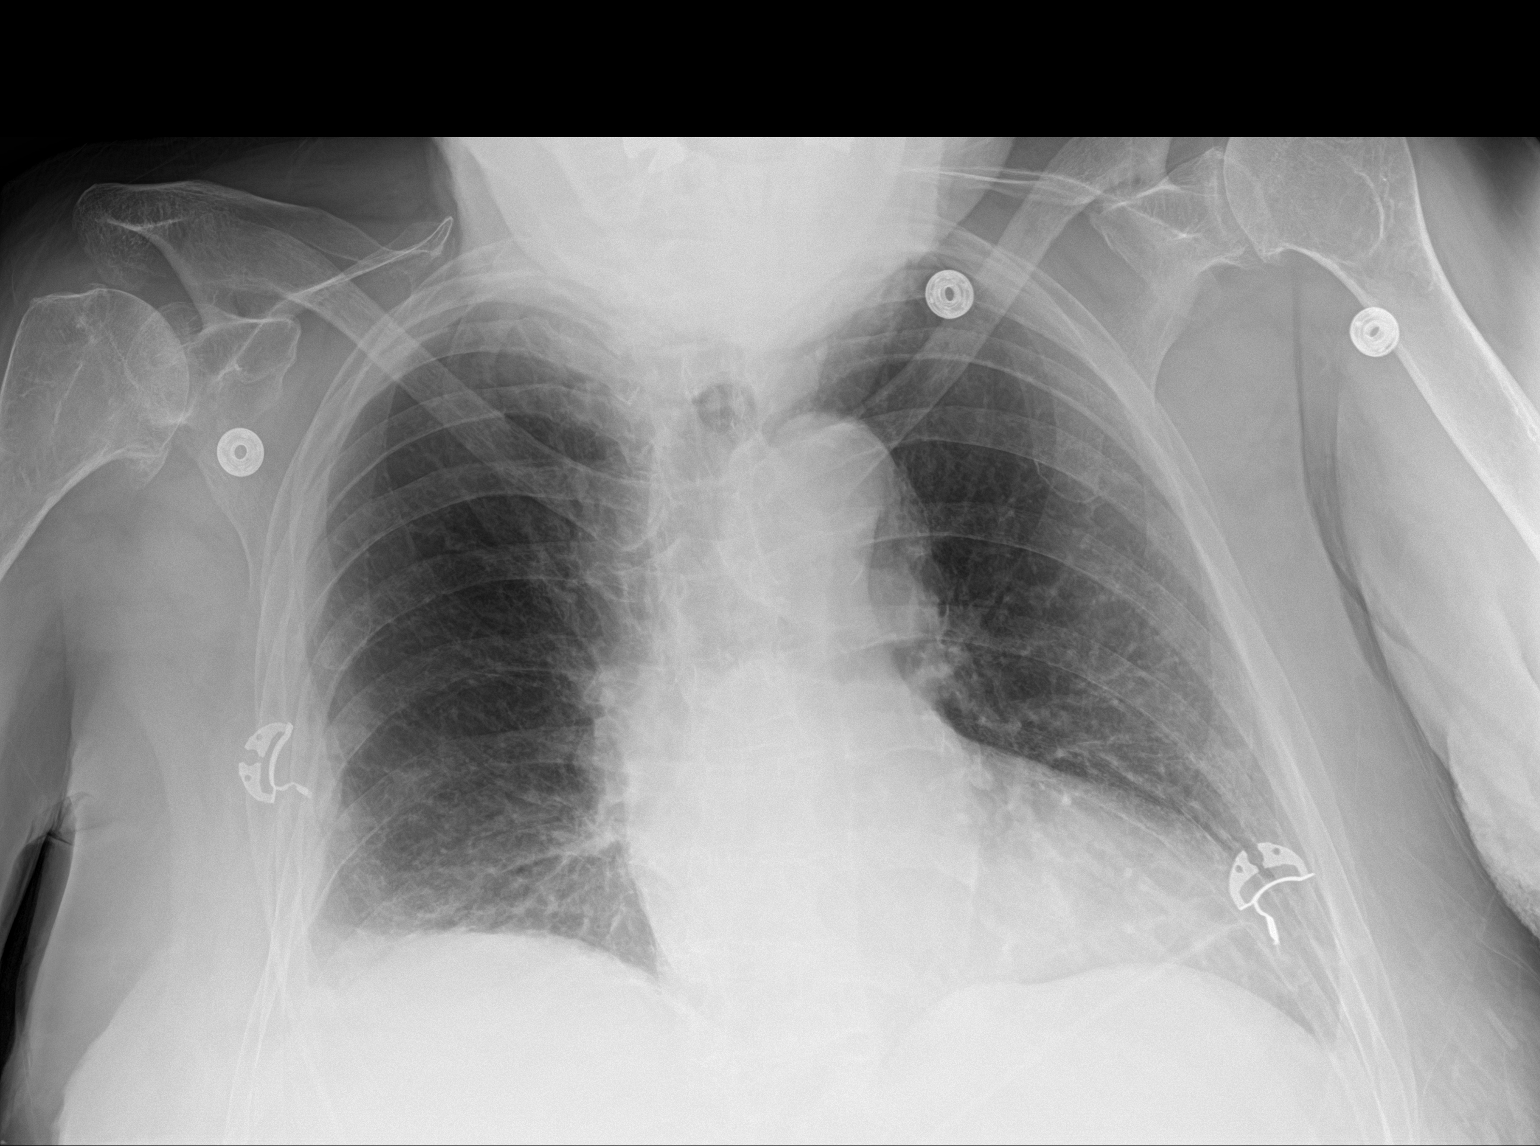

[1 of 1 positions shown; findings below may reference images not displayed]

FINDINGS: Cardiac shadow is stable in appearance. Aortic calcifications are
noted. Recently seen pigtail catheter has been removed. Stable
basilar atelectasis on the right is noted. No pneumothorax is seen.
Rib fractures on the right are again noted.
IMPRESSION: No pneumothorax following chest tube removal.

Otherwise stable appearance of the chest.

## 2019-12-08 DIAGNOSIS — I129 Hypertensive chronic kidney disease with stage 1 through stage 4 chronic kidney disease, or unspecified chronic kidney disease: Secondary | ICD-10-CM | POA: Diagnosis not present

## 2019-12-08 DIAGNOSIS — K219 Gastro-esophageal reflux disease without esophagitis: Secondary | ICD-10-CM | POA: Diagnosis not present

## 2019-12-08 DIAGNOSIS — N183 Chronic kidney disease, stage 3 unspecified: Secondary | ICD-10-CM | POA: Diagnosis not present

## 2019-12-08 DIAGNOSIS — F0390 Unspecified dementia without behavioral disturbance: Secondary | ICD-10-CM | POA: Diagnosis not present

## 2020-02-01 DIAGNOSIS — L299 Pruritus, unspecified: Secondary | ICD-10-CM | POA: Diagnosis not present

## 2020-02-01 DIAGNOSIS — R21 Rash and other nonspecific skin eruption: Secondary | ICD-10-CM | POA: Diagnosis not present

## 2020-02-04 DIAGNOSIS — I129 Hypertensive chronic kidney disease with stage 1 through stage 4 chronic kidney disease, or unspecified chronic kidney disease: Secondary | ICD-10-CM | POA: Diagnosis not present

## 2020-02-04 DIAGNOSIS — R296 Repeated falls: Secondary | ICD-10-CM | POA: Diagnosis not present

## 2020-02-04 DIAGNOSIS — N1832 Chronic kidney disease, stage 3b: Secondary | ICD-10-CM | POA: Diagnosis not present

## 2020-02-04 DIAGNOSIS — F0390 Unspecified dementia without behavioral disturbance: Secondary | ICD-10-CM | POA: Diagnosis not present

## 2020-02-04 DIAGNOSIS — G4700 Insomnia, unspecified: Secondary | ICD-10-CM | POA: Diagnosis not present

## 2020-02-04 DIAGNOSIS — K219 Gastro-esophageal reflux disease without esophagitis: Secondary | ICD-10-CM | POA: Diagnosis not present

## 2020-02-26 DIAGNOSIS — L820 Inflamed seborrheic keratosis: Secondary | ICD-10-CM | POA: Diagnosis not present

## 2020-02-26 DIAGNOSIS — L814 Other melanin hyperpigmentation: Secondary | ICD-10-CM | POA: Diagnosis not present

## 2020-02-26 DIAGNOSIS — L821 Other seborrheic keratosis: Secondary | ICD-10-CM | POA: Diagnosis not present

## 2020-02-26 DIAGNOSIS — L299 Pruritus, unspecified: Secondary | ICD-10-CM | POA: Diagnosis not present

## 2020-02-26 DIAGNOSIS — L853 Xerosis cutis: Secondary | ICD-10-CM | POA: Diagnosis not present

## 2020-03-29 DIAGNOSIS — N1832 Chronic kidney disease, stage 3b: Secondary | ICD-10-CM | POA: Diagnosis not present

## 2020-03-29 DIAGNOSIS — I129 Hypertensive chronic kidney disease with stage 1 through stage 4 chronic kidney disease, or unspecified chronic kidney disease: Secondary | ICD-10-CM | POA: Diagnosis not present

## 2020-03-29 DIAGNOSIS — F0390 Unspecified dementia without behavioral disturbance: Secondary | ICD-10-CM | POA: Diagnosis not present

## 2020-03-29 DIAGNOSIS — K219 Gastro-esophageal reflux disease without esophagitis: Secondary | ICD-10-CM | POA: Diagnosis not present

## 2020-03-30 DIAGNOSIS — E639 Nutritional deficiency, unspecified: Secondary | ICD-10-CM | POA: Diagnosis not present

## 2020-03-30 DIAGNOSIS — D513 Other dietary vitamin B12 deficiency anemia: Secondary | ICD-10-CM | POA: Diagnosis not present

## 2020-03-30 DIAGNOSIS — N183 Chronic kidney disease, stage 3 unspecified: Secondary | ICD-10-CM | POA: Diagnosis not present

## 2020-03-30 DIAGNOSIS — I129 Hypertensive chronic kidney disease with stage 1 through stage 4 chronic kidney disease, or unspecified chronic kidney disease: Secondary | ICD-10-CM | POA: Diagnosis not present

## 2020-04-08 DIAGNOSIS — L820 Inflamed seborrheic keratosis: Secondary | ICD-10-CM | POA: Diagnosis not present

## 2020-04-08 DIAGNOSIS — L814 Other melanin hyperpigmentation: Secondary | ICD-10-CM | POA: Diagnosis not present

## 2020-04-08 DIAGNOSIS — L821 Other seborrheic keratosis: Secondary | ICD-10-CM | POA: Diagnosis not present

## 2020-04-08 DIAGNOSIS — L299 Pruritus, unspecified: Secondary | ICD-10-CM | POA: Diagnosis not present

## 2020-05-10 DIAGNOSIS — H1033 Unspecified acute conjunctivitis, bilateral: Secondary | ICD-10-CM | POA: Diagnosis not present

## 2020-05-16 DIAGNOSIS — R239 Unspecified skin changes: Secondary | ICD-10-CM | POA: Diagnosis not present

## 2020-05-24 DIAGNOSIS — R21 Rash and other nonspecific skin eruption: Secondary | ICD-10-CM | POA: Diagnosis not present

## 2020-06-11 DIAGNOSIS — F0390 Unspecified dementia without behavioral disturbance: Secondary | ICD-10-CM | POA: Diagnosis not present

## 2020-06-11 DIAGNOSIS — I129 Hypertensive chronic kidney disease with stage 1 through stage 4 chronic kidney disease, or unspecified chronic kidney disease: Secondary | ICD-10-CM | POA: Diagnosis not present

## 2020-06-11 DIAGNOSIS — R296 Repeated falls: Secondary | ICD-10-CM | POA: Diagnosis not present

## 2020-06-11 DIAGNOSIS — R21 Rash and other nonspecific skin eruption: Secondary | ICD-10-CM | POA: Diagnosis not present

## 2020-06-11 DIAGNOSIS — N1832 Chronic kidney disease, stage 3b: Secondary | ICD-10-CM | POA: Diagnosis not present

## 2020-07-01 DIAGNOSIS — L304 Erythema intertrigo: Secondary | ICD-10-CM | POA: Diagnosis not present

## 2020-07-01 DIAGNOSIS — L821 Other seborrheic keratosis: Secondary | ICD-10-CM | POA: Diagnosis not present

## 2020-07-01 DIAGNOSIS — L853 Xerosis cutis: Secondary | ICD-10-CM | POA: Diagnosis not present

## 2020-07-29 DIAGNOSIS — L299 Pruritus, unspecified: Secondary | ICD-10-CM | POA: Diagnosis not present

## 2020-07-29 DIAGNOSIS — L304 Erythema intertrigo: Secondary | ICD-10-CM | POA: Diagnosis not present

## 2020-07-29 DIAGNOSIS — L821 Other seborrheic keratosis: Secondary | ICD-10-CM | POA: Diagnosis not present

## 2020-08-10 DIAGNOSIS — E538 Deficiency of other specified B group vitamins: Secondary | ICD-10-CM | POA: Diagnosis not present

## 2020-08-10 DIAGNOSIS — F0390 Unspecified dementia without behavioral disturbance: Secondary | ICD-10-CM | POA: Diagnosis not present

## 2020-08-10 DIAGNOSIS — I129 Hypertensive chronic kidney disease with stage 1 through stage 4 chronic kidney disease, or unspecified chronic kidney disease: Secondary | ICD-10-CM | POA: Diagnosis not present

## 2020-08-10 DIAGNOSIS — N1832 Chronic kidney disease, stage 3b: Secondary | ICD-10-CM | POA: Diagnosis not present

## 2020-08-10 DIAGNOSIS — K219 Gastro-esophageal reflux disease without esophagitis: Secondary | ICD-10-CM | POA: Diagnosis not present

## 2020-09-26 DIAGNOSIS — F0390 Unspecified dementia without behavioral disturbance: Secondary | ICD-10-CM | POA: Diagnosis not present

## 2020-09-26 DIAGNOSIS — R2681 Unsteadiness on feet: Secondary | ICD-10-CM | POA: Diagnosis not present

## 2020-09-26 DIAGNOSIS — Z9181 History of falling: Secondary | ICD-10-CM | POA: Diagnosis not present

## 2020-09-28 DIAGNOSIS — D513 Other dietary vitamin B12 deficiency anemia: Secondary | ICD-10-CM | POA: Diagnosis not present

## 2020-09-28 DIAGNOSIS — I129 Hypertensive chronic kidney disease with stage 1 through stage 4 chronic kidney disease, or unspecified chronic kidney disease: Secondary | ICD-10-CM | POA: Diagnosis not present

## 2020-09-28 DIAGNOSIS — E639 Nutritional deficiency, unspecified: Secondary | ICD-10-CM | POA: Diagnosis not present

## 2020-10-20 DIAGNOSIS — L299 Pruritus, unspecified: Secondary | ICD-10-CM | POA: Diagnosis not present

## 2020-10-20 DIAGNOSIS — N1832 Chronic kidney disease, stage 3b: Secondary | ICD-10-CM | POA: Diagnosis not present

## 2020-10-20 DIAGNOSIS — K219 Gastro-esophageal reflux disease without esophagitis: Secondary | ICD-10-CM | POA: Diagnosis not present

## 2020-10-20 DIAGNOSIS — G4700 Insomnia, unspecified: Secondary | ICD-10-CM | POA: Diagnosis not present

## 2020-10-20 DIAGNOSIS — F0390 Unspecified dementia without behavioral disturbance: Secondary | ICD-10-CM | POA: Diagnosis not present

## 2020-10-20 DIAGNOSIS — I129 Hypertensive chronic kidney disease with stage 1 through stage 4 chronic kidney disease, or unspecified chronic kidney disease: Secondary | ICD-10-CM | POA: Diagnosis not present

## 2020-10-24 DIAGNOSIS — F0390 Unspecified dementia without behavioral disturbance: Secondary | ICD-10-CM | POA: Diagnosis not present

## 2020-10-24 DIAGNOSIS — Z9181 History of falling: Secondary | ICD-10-CM | POA: Diagnosis not present

## 2020-10-24 DIAGNOSIS — R2681 Unsteadiness on feet: Secondary | ICD-10-CM | POA: Diagnosis not present

## 2020-11-04 DIAGNOSIS — G8190 Hemiplegia, unspecified affecting unspecified side: Secondary | ICD-10-CM | POA: Diagnosis not present

## 2020-11-04 DIAGNOSIS — J811 Chronic pulmonary edema: Secondary | ICD-10-CM | POA: Diagnosis not present

## 2020-11-04 DIAGNOSIS — G8194 Hemiplegia, unspecified affecting left nondominant side: Secondary | ICD-10-CM | POA: Diagnosis not present

## 2020-11-04 DIAGNOSIS — R400 Somnolence: Secondary | ICD-10-CM | POA: Diagnosis not present

## 2020-11-04 DIAGNOSIS — K219 Gastro-esophageal reflux disease without esophagitis: Secondary | ICD-10-CM | POA: Diagnosis not present

## 2020-11-04 DIAGNOSIS — N182 Chronic kidney disease, stage 2 (mild): Secondary | ICD-10-CM | POA: Diagnosis not present

## 2020-11-04 DIAGNOSIS — I714 Abdominal aortic aneurysm, without rupture: Secondary | ICD-10-CM | POA: Diagnosis not present

## 2020-11-04 DIAGNOSIS — I4891 Unspecified atrial fibrillation: Secondary | ICD-10-CM | POA: Diagnosis not present

## 2020-11-04 DIAGNOSIS — J189 Pneumonia, unspecified organism: Secondary | ICD-10-CM | POA: Diagnosis not present

## 2020-11-04 DIAGNOSIS — R1310 Dysphagia, unspecified: Secondary | ICD-10-CM | POA: Diagnosis not present

## 2020-11-04 DIAGNOSIS — R4182 Altered mental status, unspecified: Secondary | ICD-10-CM | POA: Diagnosis not present

## 2020-11-04 DIAGNOSIS — I083 Combined rheumatic disorders of mitral, aortic and tricuspid valves: Secondary | ICD-10-CM | POA: Diagnosis not present

## 2020-11-04 DIAGNOSIS — I69391 Dysphagia following cerebral infarction: Secondary | ICD-10-CM | POA: Diagnosis not present

## 2020-11-04 DIAGNOSIS — E1122 Type 2 diabetes mellitus with diabetic chronic kidney disease: Secondary | ICD-10-CM | POA: Diagnosis not present

## 2020-11-04 DIAGNOSIS — I63411 Cerebral infarction due to embolism of right middle cerebral artery: Secondary | ICD-10-CM | POA: Diagnosis not present

## 2020-11-04 DIAGNOSIS — I639 Cerebral infarction, unspecified: Secondary | ICD-10-CM | POA: Diagnosis not present

## 2020-11-04 DIAGNOSIS — I651 Occlusion and stenosis of basilar artery: Secondary | ICD-10-CM | POA: Diagnosis not present

## 2020-11-04 DIAGNOSIS — R4781 Slurred speech: Secondary | ICD-10-CM | POA: Diagnosis not present

## 2020-11-04 DIAGNOSIS — R069 Unspecified abnormalities of breathing: Secondary | ICD-10-CM | POA: Diagnosis not present

## 2020-11-04 DIAGNOSIS — I10 Essential (primary) hypertension: Secondary | ICD-10-CM | POA: Diagnosis not present

## 2020-11-04 DIAGNOSIS — Z66 Do not resuscitate: Secondary | ICD-10-CM | POA: Diagnosis not present

## 2020-11-04 DIAGNOSIS — I63511 Cerebral infarction due to unspecified occlusion or stenosis of right middle cerebral artery: Secondary | ICD-10-CM | POA: Diagnosis not present

## 2020-11-04 DIAGNOSIS — I6623 Occlusion and stenosis of bilateral posterior cerebral arteries: Secondary | ICD-10-CM | POA: Diagnosis not present

## 2020-11-04 DIAGNOSIS — I491 Atrial premature depolarization: Secondary | ICD-10-CM | POA: Diagnosis not present

## 2020-11-04 DIAGNOSIS — R29810 Facial weakness: Secondary | ICD-10-CM | POA: Diagnosis not present

## 2020-11-04 DIAGNOSIS — Z8673 Personal history of transient ischemic attack (TIA), and cerebral infarction without residual deficits: Secondary | ICD-10-CM | POA: Diagnosis not present

## 2020-11-04 DIAGNOSIS — I6603 Occlusion and stenosis of bilateral middle cerebral arteries: Secondary | ICD-10-CM | POA: Diagnosis not present

## 2020-11-04 DIAGNOSIS — G969 Disorder of central nervous system, unspecified: Secondary | ICD-10-CM | POA: Diagnosis not present

## 2020-11-04 DIAGNOSIS — D509 Iron deficiency anemia, unspecified: Secondary | ICD-10-CM | POA: Diagnosis not present

## 2020-11-04 DIAGNOSIS — N183 Chronic kidney disease, stage 3 unspecified: Secondary | ICD-10-CM | POA: Diagnosis not present

## 2020-11-04 DIAGNOSIS — D649 Anemia, unspecified: Secondary | ICD-10-CM | POA: Diagnosis not present

## 2020-11-04 DIAGNOSIS — R7303 Prediabetes: Secondary | ICD-10-CM | POA: Diagnosis not present

## 2020-11-04 DIAGNOSIS — Z0389 Encounter for observation for other suspected diseases and conditions ruled out: Secondary | ICD-10-CM | POA: Diagnosis not present

## 2020-11-04 DIAGNOSIS — I517 Cardiomegaly: Secondary | ICD-10-CM | POA: Diagnosis not present

## 2020-11-04 DIAGNOSIS — I672 Cerebral atherosclerosis: Secondary | ICD-10-CM | POA: Diagnosis not present

## 2020-11-04 DIAGNOSIS — J90 Pleural effusion, not elsewhere classified: Secondary | ICD-10-CM | POA: Diagnosis not present

## 2020-11-04 DIAGNOSIS — R0600 Dyspnea, unspecified: Secondary | ICD-10-CM | POA: Diagnosis not present

## 2020-11-04 DIAGNOSIS — J9811 Atelectasis: Secondary | ICD-10-CM | POA: Diagnosis not present

## 2020-11-04 DIAGNOSIS — I129 Hypertensive chronic kidney disease with stage 1 through stage 4 chronic kidney disease, or unspecified chronic kidney disease: Secondary | ICD-10-CM | POA: Diagnosis not present

## 2020-11-04 DIAGNOSIS — N3000 Acute cystitis without hematuria: Secondary | ICD-10-CM | POA: Diagnosis not present

## 2020-11-04 DIAGNOSIS — F0390 Unspecified dementia without behavioral disturbance: Secondary | ICD-10-CM | POA: Diagnosis not present

## 2020-11-04 DIAGNOSIS — R471 Dysarthria and anarthria: Secondary | ICD-10-CM | POA: Diagnosis not present

## 2020-11-04 DIAGNOSIS — Z87891 Personal history of nicotine dependence: Secondary | ICD-10-CM | POA: Diagnosis not present

## 2020-11-04 DIAGNOSIS — I700 Atherosclerosis of aorta: Secondary | ICD-10-CM | POA: Diagnosis not present

## 2020-11-04 DIAGNOSIS — R0602 Shortness of breath: Secondary | ICD-10-CM | POA: Diagnosis not present

## 2020-11-04 DIAGNOSIS — E871 Hypo-osmolality and hyponatremia: Secondary | ICD-10-CM | POA: Diagnosis not present

## 2020-11-10 DIAGNOSIS — M6281 Muscle weakness (generalized): Secondary | ICD-10-CM | POA: Diagnosis not present

## 2020-11-10 DIAGNOSIS — I69354 Hemiplegia and hemiparesis following cerebral infarction affecting left non-dominant side: Secondary | ICD-10-CM | POA: Diagnosis not present

## 2020-11-10 DIAGNOSIS — R2681 Unsteadiness on feet: Secondary | ICD-10-CM | POA: Diagnosis not present

## 2020-11-10 DIAGNOSIS — Z9181 History of falling: Secondary | ICD-10-CM | POA: Diagnosis not present

## 2020-11-10 DIAGNOSIS — F0280 Dementia in other diseases classified elsewhere without behavioral disturbance: Secondary | ICD-10-CM | POA: Diagnosis not present

## 2020-11-10 DIAGNOSIS — R278 Other lack of coordination: Secondary | ICD-10-CM | POA: Diagnosis not present

## 2020-11-10 DIAGNOSIS — R41841 Cognitive communication deficit: Secondary | ICD-10-CM | POA: Diagnosis not present

## 2020-11-10 DIAGNOSIS — I69322 Dysarthria following cerebral infarction: Secondary | ICD-10-CM | POA: Diagnosis not present

## 2020-11-10 DIAGNOSIS — R1312 Dysphagia, oropharyngeal phase: Secondary | ICD-10-CM | POA: Diagnosis not present

## 2020-11-10 DIAGNOSIS — I69391 Dysphagia following cerebral infarction: Secondary | ICD-10-CM | POA: Diagnosis not present

## 2020-11-18 DIAGNOSIS — I4891 Unspecified atrial fibrillation: Secondary | ICD-10-CM | POA: Diagnosis not present

## 2020-11-18 DIAGNOSIS — R1310 Dysphagia, unspecified: Secondary | ICD-10-CM | POA: Diagnosis not present

## 2020-11-18 DIAGNOSIS — K219 Gastro-esophageal reflux disease without esophagitis: Secondary | ICD-10-CM | POA: Diagnosis not present

## 2020-11-18 DIAGNOSIS — I639 Cerebral infarction, unspecified: Secondary | ICD-10-CM | POA: Diagnosis not present

## 2020-11-18 DIAGNOSIS — I714 Abdominal aortic aneurysm, without rupture: Secondary | ICD-10-CM | POA: Diagnosis not present

## 2020-11-18 DIAGNOSIS — N183 Chronic kidney disease, stage 3 unspecified: Secondary | ICD-10-CM | POA: Diagnosis not present

## 2020-11-18 DIAGNOSIS — F0390 Unspecified dementia without behavioral disturbance: Secondary | ICD-10-CM | POA: Diagnosis not present

## 2020-11-18 DIAGNOSIS — D649 Anemia, unspecified: Secondary | ICD-10-CM | POA: Diagnosis not present

## 2020-11-18 DIAGNOSIS — I129 Hypertensive chronic kidney disease with stage 1 through stage 4 chronic kidney disease, or unspecified chronic kidney disease: Secondary | ICD-10-CM | POA: Diagnosis not present

## 2020-11-21 DIAGNOSIS — J90 Pleural effusion, not elsewhere classified: Secondary | ICD-10-CM | POA: Diagnosis not present

## 2020-11-24 DIAGNOSIS — I69391 Dysphagia following cerebral infarction: Secondary | ICD-10-CM | POA: Diagnosis not present

## 2020-11-24 DIAGNOSIS — Z9181 History of falling: Secondary | ICD-10-CM | POA: Diagnosis not present

## 2020-11-24 DIAGNOSIS — I69322 Dysarthria following cerebral infarction: Secondary | ICD-10-CM | POA: Diagnosis not present

## 2020-11-24 DIAGNOSIS — M6281 Muscle weakness (generalized): Secondary | ICD-10-CM | POA: Diagnosis not present

## 2020-11-24 DIAGNOSIS — F0280 Dementia in other diseases classified elsewhere without behavioral disturbance: Secondary | ICD-10-CM | POA: Diagnosis not present

## 2020-11-24 DIAGNOSIS — R1312 Dysphagia, oropharyngeal phase: Secondary | ICD-10-CM | POA: Diagnosis not present

## 2020-11-24 DIAGNOSIS — R278 Other lack of coordination: Secondary | ICD-10-CM | POA: Diagnosis not present

## 2020-11-24 DIAGNOSIS — I69354 Hemiplegia and hemiparesis following cerebral infarction affecting left non-dominant side: Secondary | ICD-10-CM | POA: Diagnosis not present

## 2020-11-24 DIAGNOSIS — R41841 Cognitive communication deficit: Secondary | ICD-10-CM | POA: Diagnosis not present

## 2020-11-24 DIAGNOSIS — R2681 Unsteadiness on feet: Secondary | ICD-10-CM | POA: Diagnosis not present

## 2020-11-25 DIAGNOSIS — L03115 Cellulitis of right lower limb: Secondary | ICD-10-CM | POA: Diagnosis not present

## 2020-11-30 DIAGNOSIS — I6389 Other cerebral infarction: Secondary | ICD-10-CM | POA: Diagnosis not present

## 2020-12-14 DIAGNOSIS — I4891 Unspecified atrial fibrillation: Secondary | ICD-10-CM | POA: Diagnosis not present

## 2020-12-14 DIAGNOSIS — K219 Gastro-esophageal reflux disease without esophagitis: Secondary | ICD-10-CM | POA: Diagnosis not present

## 2020-12-14 DIAGNOSIS — Z7901 Long term (current) use of anticoagulants: Secondary | ICD-10-CM | POA: Diagnosis not present

## 2020-12-14 DIAGNOSIS — I10 Essential (primary) hypertension: Secondary | ICD-10-CM | POA: Diagnosis not present

## 2020-12-14 DIAGNOSIS — I69354 Hemiplegia and hemiparesis following cerebral infarction affecting left non-dominant side: Secondary | ICD-10-CM | POA: Diagnosis not present

## 2020-12-21 DIAGNOSIS — K1120 Sialoadenitis, unspecified: Secondary | ICD-10-CM | POA: Diagnosis not present

## 2020-12-21 DIAGNOSIS — R638 Other symptoms and signs concerning food and fluid intake: Secondary | ICD-10-CM | POA: Diagnosis not present

## 2021-01-24 DEATH — deceased
